# Patient Record
Sex: Female | Born: 1960 | Race: White | Hispanic: No | Marital: Single
Health system: Midwestern US, Community
[De-identification: ages and names within clinical notes are randomized; demographics above are authoritative.]

## PROBLEM LIST (undated history)

## (undated) DIAGNOSIS — E119 Type 2 diabetes mellitus without complications: Secondary | ICD-10-CM

## (undated) DIAGNOSIS — Z87440 Personal history of urinary (tract) infections: Secondary | ICD-10-CM

## (undated) DIAGNOSIS — R112 Nausea with vomiting, unspecified: Secondary | ICD-10-CM

## (undated) DIAGNOSIS — Z86010 Personal history of colonic polyps: Secondary | ICD-10-CM

## (undated) DIAGNOSIS — T7840XA Allergy, unspecified, initial encounter: Secondary | ICD-10-CM

## (undated) DIAGNOSIS — K219 Gastro-esophageal reflux disease without esophagitis: Secondary | ICD-10-CM

## (undated) DIAGNOSIS — Z9889 Other specified postprocedural states: Secondary | ICD-10-CM

## (undated) DIAGNOSIS — R7303 Prediabetes: Secondary | ICD-10-CM

## (undated) DIAGNOSIS — I493 Ventricular premature depolarization: Secondary | ICD-10-CM

## (undated) HISTORY — DX: Gastro-esophageal reflux disease without esophagitis: K21.9

## (undated) HISTORY — DX: Prediabetes: R73.03

## (undated) HISTORY — PX: KNEE SURGERY: SHX244

## (undated) HISTORY — DX: Allergy, unspecified, initial encounter: T78.40XA

## (undated) HISTORY — PX: COLONOSCOPY: SHX174

## (undated) HISTORY — DX: Personal history of colonic polyps: Z86.010

## (undated) HISTORY — DX: Ventricular premature depolarization: I49.3

## (undated) HISTORY — DX: Nausea with vomiting, unspecified: R11.2

## (undated) HISTORY — PX: NASAL SINUS SURGERY: SHX719

## (undated) HISTORY — DX: Personal history of urinary (tract) infections: Z87.440

## (undated) HISTORY — DX: Type 2 diabetes mellitus without complications: E11.9

## (undated) HISTORY — PX: POLYPECTOMY: SHX149

## (undated) HISTORY — DX: Other specified postprocedural states: Z98.890

---
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
## 1993-04-18 HISTORY — PX: VAGINAL HYSTERECTOMY: SUR661

## 2005-04-14 ENCOUNTER — Ambulatory Visit: Payer: Self-pay | Admitting: Cardiology

## 2005-04-18 HISTORY — PX: ESOPHAGOGASTRODUODENOSCOPY: SHX1529

## 2005-04-27 ENCOUNTER — Ambulatory Visit: Payer: Self-pay | Admitting: Cardiology

## 2005-06-06 ENCOUNTER — Ambulatory Visit: Payer: Self-pay | Admitting: Cardiology

## 2005-07-06 ENCOUNTER — Ambulatory Visit: Payer: Self-pay | Admitting: Cardiology

## 2006-01-09 ENCOUNTER — Ambulatory Visit: Payer: Self-pay | Admitting: Internal Medicine

## 2006-01-12 ENCOUNTER — Encounter (INDEPENDENT_AMBULATORY_CARE_PROVIDER_SITE_OTHER): Payer: Self-pay | Admitting: *Deleted

## 2006-01-12 ENCOUNTER — Ambulatory Visit: Payer: Self-pay | Admitting: Internal Medicine

## 2006-02-14 ENCOUNTER — Ambulatory Visit: Payer: Self-pay | Admitting: Internal Medicine

## 2006-10-04 ENCOUNTER — Ambulatory Visit: Payer: Self-pay | Admitting: Cardiology

## 2007-01-09 ENCOUNTER — Ambulatory Visit: Payer: Self-pay | Admitting: Cardiology

## 2007-07-11 ENCOUNTER — Emergency Department (HOSPITAL_COMMUNITY): Admission: EM | Admit: 2007-07-11 | Discharge: 2007-07-12 | Payer: Self-pay | Admitting: Emergency Medicine

## 2007-08-06 ENCOUNTER — Emergency Department (HOSPITAL_COMMUNITY): Admission: EM | Admit: 2007-08-06 | Discharge: 2007-08-06 | Payer: Self-pay | Admitting: Emergency Medicine

## 2009-05-29 ENCOUNTER — Ambulatory Visit (HOSPITAL_COMMUNITY): Admission: RE | Admit: 2009-05-29 | Discharge: 2009-05-29 | Payer: Self-pay | Admitting: Family Medicine

## 2010-08-31 NOTE — Assessment & Plan Note (Signed)
[facility]                          [facility]   Gwenlyn Fudge ASALEE BARRETTE                 MRN:          [id number]  DATE:10/04/2006                            DOB:          25-Jul-1960    HISTORY OF PRESENT ILLNESS:  Patient is a 50 year old female with a  history of chest pain and palpitations.  The patient underwent a cardiac  CT scan that was essentially negative for coronary artery disease.  The  patient reports occasional sharp shooting pain which are very atypical.  She still has occasional palpitations, and they have been in the past  correlated with documented PVCs.  She was also last year treated for  eosinophilic esophagitis.  She states that her problems have markedly  improved since last year.  She is still concerned about occasional  palpitations which cause her some level of discomfort.   MEDICATIONS:  1. Omeprazole 20 mg a day.  2. Estrodiol 2 mg a day.  3. Lexapro 10 mg a day.  4. Advair as directed.   PHYSICAL EXAMINATION:  VITAL SIGNS:  Blood pressure 128/89, heart rate  82 beats per minute.  Weight is 170 pounds.  NECK:  Normal carotid upstrokes.  No carotid bruits.  LUNGS:  Clear breath sounds bilaterally.  HEART:  Regular rate and rhythm.  Normal S1 and S2.  No murmurs, gallops  or rubs.  ABDOMEN:  Soft, nontender.  No rebound or guarding.  Good bowel sounds.  EXTREMITIES:  No clubbing, cyanosis or edema.  NEURO:  Patient is alert and oriented, grossly nonfocal.   PROBLEM LIST:  1. Atypical chest pain with negative cardiac CT scan.  2. Palpitations with documented premature ventricular contractions.  3. Anxiety.  4. Status post eosinophilic esophagitis.   PLAN:  1. I had a long discussion with the patient regarding PVCs.  She says      she feels they are causing some level of discomfort and is open to      further treatment.  2. Given the fact that the patient could also have esophageal spasm, I  have opted for low-dose Cardizem CD dosing on 120 mg p.o. daily.  3. EKG was reviewed in the office and was essentially within normal      limits.  I told the patient that we will follow up with her in      eight months and assess her response to treatment.    Learta Codding, MD,FACC  Electronically Signed   GED/MedQ  DD: 10/04/2006  DT: 10/04/2006  Job #: [id number]

## 2010-08-31 NOTE — Assessment & Plan Note (Signed)
[facility]                          [facility]   Michele Lynch MARCELLA CHARLSON                 MRN:          [id number]  DATE:01/09/2007                            DOB:          Mar 04, 1961    HISTORY OF PRESENT ILLNESS:  The patient is a 50 year old female with a  history of chest pain and palpitations. The patient has been previously  screened with a cardiac CT scan and had a calcium score of 0 and no  significant epicardial coronary artery disease. She also had a normal  stress test. She still reports occasional palpitations, but appeared to  have much improved with the addition of Cardizem during the last visit.  The patient states that occasionally she has substernal chest pain  associated with palpitations, but there is no exertional chest pain or  dyspnea. The patient has lost quite a bit of weight and is making a  concerted effort to lose more weight. She states also she is under a  large amount of stress.   MEDICATIONS:  1. Estradiol 2 mg p.o. daily.  2. Cardizem 120 mg p.o. daily.   PHYSICAL EXAMINATION:  VITAL SIGNS: Blood pressure 113/72, heart rate 85  beats per minute, weight is 157 pounds.  NECK: Normal carotid upstroke. No carotid bruits.  LUNGS:  Clear breath sounds bilaterally.  HEART: Regular rate and rhythm. Normal S1, S2. No murmur, rubs or  gallops.  ABDOMEN: Soft and nontender. No rebound or guarding. Good bowel sounds.  EXTREMITIES: No cyanosis, clubbing or edema.  NEURO: The patient is alert, oriented and grossly nonfocal.   PROBLEM LIST:  1. Atypical chest pain. Negative cardiac CT.  2. Palpitations with improvement on calcium channel blocker therapy.  3. Anxiety.  4. Status post eosinophilic esophagitis.   PLAN:  1. The patient is doing much better. Still has occasional      palpitations. We will increase her Cardizem to 180 mg p.o. daily.  2. The patient continues to lose weight and the patient is  adhering to      a healthier lifestyle.  3. Also, have given the patient a prescription for p.r.n.      nitroglycerine as she has reminded me that I promised this to her      the last time, although I still suspect that the patient's chest      pain may be more related to esophageal spasm. I told her certainly      if there is any concern of      angina that nitroglycerine might be useful. However, she still      report to me if she needs to use nitroglycerine and further      evaluation may be required.     Learta Codding, MD,FACC  Electronically Signed    GED/MedQ  DD: 01/09/2007  DT: 01/09/2007  Job #: [id number]   cc:   Donzetta Sprung, MD

## 2010-09-03 NOTE — Assessment & Plan Note (Signed)
[facility]                           GASTROENTEROLOGY OFFICE NOTE   Michele Lynch, Michele Lynch                 MRN:          [id number]  DATE:01/09/2006                            DOB:          March 19, 1961    GASTROENTEROLOGY CONSULTATION NOTE:   CHIEF COMPLAINT:  Lump in throat, swallowing problems, chest pain.   ASSESSMENT:  A 50 year old white woman with an extensive cardiology workup,  not revealing a cause for chest pain.  She does have some intermittent solid  food dysphagia, some occasional heartburn, and intermittent episodes of  chest pain for about a year.  She also has a globus sensation and some  hoarseness.  Her symptoms have not really improved on Omeprazole twice daily  for 3 months and Nexium daily prior to that.   RECOMMENDATIONS AND PLAN:  1. Upper GI endoscopy with possible esophageal dilatation.  Risks,      benefits and indications are explained.  2. Consider a HIDA scan with ejection fraction.  An ultrasound has been      negative.  3. Consider manometry and/or barium swallow.  She has had some relief with      nitroglycerin, which does suggest some esophageal dysmotility.   HISTORY:  This pleasant 50 year old white woman has been seen in [place] and  been hospitalized there, and Dr. Andee Lineman has cared for her.  She has had  intermittent substernal chest pain, helped by nitroglycerin, but has had an  extensive evaluation including cardiac CT and rule out enzymes and negative  adenosine Cardiolite studies back into 2006.  It sounds like Nexium helped  for a while, or at least she got better, in view of an office note in  February of 2007, but the symptoms have returned.  She tells me they were  present on Nexium and that it did not occur just with the switch to  omeprazole which was driven by insurance and cost issues.  She has had a  question of polyps on an ultrasound, and Dr. Reuel Boom suggested ordered a [facility]  HIDA scan, but it  sounds like that has not been done, though it was done 12  years ago when she had similar problems at the time of a hysterectomy.  She  has been given a trial of Xanax, Prozac and maybe even tricyclic agents (not  sure).  She has had palpitations, as well, and says her husband seems to  have heart rate increases, palpitations and diarrhea during some of the same  times she has these problems.  If she does not drink a lot of water with her  food, she feels like she will choke on it.  She does seem to describe some  suprasternal sticking points at times and has a constant lump in the throat  often.  She occasionally will have a hot burning sensation in the chest  characterized as reflux that is helped by Pepto-Bismol.  Her thyroid studies  have been normal.   MEDICATIONS:  1. Omeprazole 20 mg b.i.d.  2. Estradiol 2 mg daily.  3. Valtrex is used as needed.  This was given for suspected viral syndrome  and cold sores that also occurred with her husband, she tells me.   DRUG ALLERGIES:  None known.   PAST MEDICAL HISTORY:  1. As above.  2. Allergic rhinosinusitis with sinus surgery x2 and not an active problem      now.  3. Cesarean section x2.  4. Bladder stem dilation at age 49.  5. Possible irritable bowel syndrome diagnosis.  6. Prior hysterectomy and oophorectomy in 1995 (bilateral).  7. Tonsillectomy and adenoidectomy.   FAMILY HISTORY:  Our review is negative for GI problems including colon  cancer.  There is a history of leukemia in the family.   SOCIAL HISTORY:  She is married.  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  Two sons.  Rare alcohol.  No tobacco or drugs.   REVIEW OF SYSTEMS:  A few episodes of fatigue and night sweats.  She does  have some sore throat.  Otherwise, as above or negative.  See medical  history form.   PHYSICAL EXAMINATION:  GENERAL:  Well-developed, well-nourished white woman.  Height 5 feet 3 inches, weight 181 pounds.  She is  overweight to obese.  VITAL SIGNS:  Blood pressure 110/70, pulse 76.  HEENT:  Eyes anicteric.  ENT normal mouth and oropharynx.  NECK:  Supple.  No thyromegaly or mass.  CHEST:  Clear.  HEART:  S1 and S2.  No murmurs or gallops.  ABDOMEN:  Soft, nontender, without organomegaly or mass.  There are surgical  scars from a previous hysterectomy.  EXTREMITIES:  No peripheral edema.  SKIN:  No rash.  NEUROLOGIC/PSYCHIATRIC:  She is alert and oriented x3.   DATA REVIEWED:  Office notes from Dr. Reuel Boom and Dr. Andee Lineman.  Hospitalization records from [facility] this year.   NOTE:  A diagnosis of panic attacks is described in her hospitalization  records.  She has been given a trial of Lexapro, Astelin nasal spray.  She  has been on Xenical.  There is also a history of a previous exploratory  laparotomy and hyperlipidemia.                                   Iva Boop, MD,FACG   CEG/MedQ  DD:  01/10/2006  DT:  01/12/2006  Job #:  [id number]   cc:   Noah Charon, MD,FACC

## 2010-09-03 NOTE — Assessment & Plan Note (Signed)
[facility]                           GASTROENTEROLOGY OFFICE NOTE   Michele Lynch, Michele Lynch                 MRN:          [id number]  DATE:02/14/2006                            DOB:          1961/02/28    CHIEF COMPLAINT:  Followup of eosinophilic esophagitis.   Ms. Michele Lynch had started fluticasone. I had told her to try that starting on  October 5. The lump in the throat is a little better, I think chest pain is  a little bit better. She is complaining of constipation problems for the  past year where she is having difficulty moving her bowels, having to take  Dulcolax every day. This is a new problem. However, all of these problems  are similar to the problems she had 12 or 13 years ago and she had an EGD  and a colonoscopy at that time. Other medications including her omeprazole  and her Estradiol. Valtrex p.r.n. and Dulcolax. There are no known drug  allergies.   REVIEW OF SYSTEMS:  Her weight has increased about 20 pounds or so over a  period of a year or so. Her TSH was normal a year ago.   PAST MEDICAL HISTORY:  Reviewed and unchanged from consultation of January 09, 2006.   Weight 186 pounds, pulse 70, blood pressure 120/86.   ASSESSMENT:  1. Working diagnosis of eosinophilic esophagitis based upon endoscopic      findings and biopsies. There are increased eosinophils in the biopsies      of the esophagus. Just starting on fluticasone therapy with some      response. I had told her to take the fluticasone and then swallow      water, that was incorrect. She should rinse the mouth and spit it out      after swallowing that. She is using that twice a day. Will continue      that.  2. Change in bowel habits with constipation, bloating, weight gain. TSH      was normal previously. Will check a TSH but also I think at her age      with the change in bowel habits, a colonoscopy is appropriate. The      risks, benefits, and indications  are explained to the patient. She      understands and agrees to proceed.   PLAN:  As outlined above. Further plans pending clinical course. Consider  food allergy testing. 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 She had allergy  testing a number of years ago with these problems. I am not aware that  eosinophilic esophagitis is related to pet allergies but will look into the  availability of food allergy testing.     Iva Boop, MD,FACG   CEG/MedQ  DD: 02/14/2006  DT: 02/14/2006  Job #: [id number]   cc:   Noah Charon, MD,FACC

## 2011-01-10 LAB — CBC
Platelets: 365
RDW: 12.3
WBC: 10.4

## 2011-01-10 LAB — PROTIME-INR
INR: 0.9
Prothrombin Time: 12.3

## 2011-01-10 LAB — DIFFERENTIAL
Lymphocytes Relative: 30
Lymphs Abs: 3.2
Monocytes Absolute: 0.9
Neutrophils Relative %: 54

## 2011-01-10 LAB — RAPID URINE DRUG SCREEN, HOSP PERFORMED
Amphetamines: NOT DETECTED
Benzodiazepines: NOT DETECTED
Cocaine: NOT DETECTED
Tetrahydrocannabinol: NOT DETECTED

## 2011-01-10 LAB — BASIC METABOLIC PANEL
BUN: 8
GFR calc Af Amer: >60
Glucose, Bld: 115 — ABNORMAL HIGH
Potassium: 3.5

## 2011-01-11 LAB — CBC
Hemoglobin: 13.9
MCHC: 34.6
Platelets: 331

## 2011-01-11 LAB — URINALYSIS, ROUTINE W REFLEX MICROSCOPIC: Nitrite: NEGATIVE

## 2011-01-11 LAB — COMPREHENSIVE METABOLIC PANEL
ALT: 52 — ABNORMAL HIGH
BUN: 9
CO2: 31
Calcium: 9
Chloride: 104
Creatinine, Ser: 0.78
GFR calc Af Amer: >60
Glucose, Bld: 95
Potassium: 4
Sodium: 140

## 2011-01-11 LAB — DIFFERENTIAL
Eosinophils Relative: 4
Lymphs Abs: 3
Neutro Abs: 4.7
Neutrophils Relative %: 54

## 2011-01-11 LAB — APTT: aPTT: 27

## 2012-10-16 ENCOUNTER — Encounter: Payer: Self-pay | Admitting: Internal Medicine

## 2012-12-12 ENCOUNTER — Ambulatory Visit (AMBULATORY_SURGERY_CENTER): Payer: 59 | Admitting: *Deleted

## 2012-12-12 VITALS — Ht 64.0 in | Wt 209.6 lb

## 2012-12-12 DIAGNOSIS — Z1211 Encounter for screening for malignant neoplasm of colon: Secondary | ICD-10-CM

## 2012-12-12 MED ORDER — NA SULFATE-K SULFATE-MG SULF 17.5-3.13-1.6 GM/177ML PO SOLN: 1.0000 | Freq: Once | ORAL | Status: DC

## 2012-12-12 NOTE — Progress Notes (Signed)
No allergies to eggs or soy. No problems with anesthesia.  

## 2012-12-13 ENCOUNTER — Encounter: Payer: Self-pay | Admitting: Internal Medicine

## 2012-12-14 DIAGNOSIS — R079 Chest pain, unspecified: Secondary | ICD-10-CM

## 2012-12-26 ENCOUNTER — Ambulatory Visit (AMBULATORY_SURGERY_CENTER): Payer: 59 | Admitting: Internal Medicine

## 2012-12-26 ENCOUNTER — Encounter: Payer: Self-pay | Admitting: Internal Medicine

## 2012-12-26 VITALS — BP 112/70 | HR 65 | Temp 97.0°F | Resp 34 | Ht 64.0 in | Wt 203.0 lb

## 2012-12-26 DIAGNOSIS — D126 Benign neoplasm of colon, unspecified: Secondary | ICD-10-CM

## 2012-12-26 DIAGNOSIS — Z1211 Encounter for screening for malignant neoplasm of colon: Secondary | ICD-10-CM

## 2012-12-26 MED ORDER — SODIUM CHLORIDE 0.9 % IV SOLN: 500.0000 mL | INTRAVENOUS | Status: DC

## 2012-12-26 NOTE — Patient Instructions (Addendum)
I found and removed 2 tiny polyps. They look benign - I was only able to pick up one of the polyps.  Everything else was normal.  I will let you know pathology results and when to have another routine colonoscopy by mail.   I appreciate the opportunity to care for you. Iva Boop, MD, FACG   YOU HAD AN ENDOSCOPIC PROCEDURE TODAY AT THE [facility]: Refer to the procedure report that was given to you for any specific questions about what was found during the examination.  If the procedure report does not answer your questions, please call your gastroenterologist to clarify.  If you requested that your care partner not be given the details of your procedure findings, then the procedure report has been included in a sealed envelope for you to review at your convenience later.  YOU SHOULD EXPECT: Some feelings of bloating in the abdomen. Passage of more gas than usual.  Walking can help get rid of the air that was put into your GI tract during the procedure and reduce the bloating. If you had a lower endoscopy (such as a colonoscopy or flexible sigmoidoscopy) you may notice spotting of blood in your stool or on the toilet paper. If you underwent a bowel prep for your procedure, then you may not have a normal bowel movement for a few days.  DIET: Your first meal following the procedure should be a light meal and then it is ok to progress to your normal diet.  A half-sandwich or bowl of soup is an example of a good first meal.  Heavy or fried foods are harder to digest and may make you feel nauseous or bloated.  Likewise meals heavy in dairy and vegetables can cause extra gas to form and this can also increase the bloating.  Drink plenty of fluids but you should avoid alcoholic beverages for 24 hours.  ACTIVITY: Your care partner should take you home directly after the procedure.  You should plan to take it easy, moving slowly for the rest of the day.  You can resume normal activity the  day after the procedure however you should NOT DRIVE or use heavy machinery for 24 hours (because of the sedation medicines used during the test).    SYMPTOMS TO REPORT IMMEDIATELY: A gastroenterologist can be reached at any hour.  During normal business hours, 8:30 AM to 5:00 PM Monday through Friday, call [phone].  After hours and on weekends, please call the GI answering service at [phone] who will take a message and have the physician on call contact you.   Following lower endoscopy (colonoscopy or flexible sigmoidoscopy):  Excessive amounts of blood in the stool  Significant tenderness or worsening of abdominal pains  Swelling of the abdomen that is new, acute  Fever of 100F or higher FOLLOW UP: If any biopsies were taken you will be contacted by phone or by letter within the next 1-3 weeks.  Call your gastroenterologist if you have not heard about the biopsies in 3 weeks.  Our staff will call the home number listed on your records the next business day following your procedure to check on you and address any questions or concerns that you may have at that time regarding the information given to you following your procedure. This is a courtesy call and so if there is no answer at the home number and we have not heard from you through the emergency physician on call, we will assume that  you have returned to your regular daily activities without incident.  SIGNATURES/CONFIDENTIALITY: You and/or your care partner have signed paperwork which will be entered into your electronic medical record.  These signatures attest to the fact that that the information above on your After Visit Summary has been reviewed and is understood.  Full responsibility of the confidentiality of this discharge information lies with you and/or your care-partner.  Please read over handouts about polyps  Continue your normal medications

## 2012-12-26 NOTE — Progress Notes (Signed)
Called to room to assist during endoscopic procedure.  Patient ID and intended procedure confirmed with present staff. Received instructions for my participation in the procedure from the performing physician.  

## 2012-12-26 NOTE — Progress Notes (Signed)
Patient did not experience any of the following events: a burn prior to discharge; a fall within the facility; wrong site/side/patient/procedure/implant event; or a hospital transfer or hospital admission upon discharge from the facility. (G8907) Patient did not have preoperative order for IV antibiotic SSI prophylaxis. (G8918)  

## 2012-12-26 NOTE — Progress Notes (Signed)
Lidocaine-40mg IV prior to Propofol InductionPropofol given over incremental dosages 

## 2012-12-26 NOTE — Op Note (Signed)
[facility] [address] [place], [address]   COLONOSCOPY PROCEDURE REPORT  PATIENT: [name], [name]  MR#: [id number] BIRTHDATE: 1960/06/05 , 51  yrs. old GENDER: Female ENDOSCOPIST: Iva Boop, MD, [facility] PROCEDURE DATE:  12/26/2012 PROCEDURE:   Colonoscopy with snare polypectomy First Screening Colonoscopy - Avg.  risk and is 50 yrs.  old or older Yes.  Prior Negative Screening - Now for repeat screening. N/A  History of Adenoma - Now for follow-up colonoscopy & has been > or = to 3 yrs.  N/A  Polyps Removed Today? Yes. ASA CLASS:   Class II INDICATIONS:average risk screening and first colonoscopy. MEDICATIONS: propofol (Diprivan) 300mg  IV, MAC sedation, administered by CRNA, and These medications were titrated to patient response per physician's verbal order  DESCRIPTION OF PROCEDURE:   After the risks benefits and alternatives of the procedure were thoroughly explained, informed consent was obtained.  A digital rectal exam revealed no abnormalities of the rectum.   The LB [id number]  endoscope was introduced through the anus and advanced to the cecum, which was identified by both the appendix and ileocecal valve. No adverse events experienced.   The quality of the prep was excellent using Suprep  The instrument was then slowly withdrawn as the colon was fully examined.   COLON FINDINGS: Two diminutive sessile polyps were found in the ascending colon and at the splenic flexure.  A polypectomy was performed with a cold snare.  The resection was complete and the polyp tissue was partially retrieved (splenic flexure polyp not recovered).   The colon mucosa was otherwise normal.   A right colon retroflexion was performed.  Retroflexed views revealed no abnormalities. The time to cecum=2 minutes 0 seconds.  Withdrawal time=11 minutes 22 seconds.  The scope was withdrawn and the procedure completed. COMPLICATIONS: There were no  complications.  ENDOSCOPIC IMPRESSION: 1.   Two diminutive sessile polyps were found in the ascending colon and at the splenic flexure; polypectomy was performed with a cold snare 1 of 2 polyps recovered 2.   The colon mucosa was otherwise normal - excellent prep - first colonoscopy  RECOMMENDATIONS: Timing of repeat colonoscopy will be determined by pathology findings.   eSigned:  Iva Boop, MD, [facility] 12/26/2012 10:44 AM   cc: The Patient and Donzetta Sprung, MD   PATIENT NAME:  Michele Lynch, Michele Lynch MR#: [id number]

## 2012-12-27 ENCOUNTER — Telehealth: Payer: Self-pay

## 2012-12-27 NOTE — Telephone Encounter (Signed)
  Follow up Call-  Call back number 12/26/2012  Post procedure Call Back phone  # [phone]  Permission to leave phone message Yes     Patient questions:  Do you have a fever, pain , or abdominal swelling? no Pain Score  0 *  Have you tolerated food without any problems? yes  Have you been able to return to your normal activities? yes  Do you have any questions about your discharge instructions: Diet   no Medications  no Follow up visit  no  Do you have questions or concerns about your Care? no  Actions: * If pain score is 4 or above: No action needed, pain <4.

## 2013-01-01 ENCOUNTER — Encounter: Payer: Self-pay | Admitting: Internal Medicine

## 2013-01-01 DIAGNOSIS — Z860100 Personal history of colon polyps, unspecified: Secondary | ICD-10-CM | POA: Insufficient documentation

## 2013-01-01 HISTORY — DX: Personal history of colon polyps, unspecified: Z86.0100

## 2013-01-01 HISTORY — DX: Personal history of colonic polyps: Z86.010

## 2013-01-01 NOTE — Progress Notes (Signed)
Quick Note:  Sessile serrated polyp - diminutive Repeat colon 5 yrs - 2019 ______

## 2014-01-07 ENCOUNTER — Encounter: Payer: Self-pay | Admitting: Family Medicine

## 2014-01-07 ENCOUNTER — Ambulatory Visit (INDEPENDENT_AMBULATORY_CARE_PROVIDER_SITE_OTHER): Payer: 59 | Admitting: Family Medicine

## 2014-01-07 VITALS — BP 122/82 | HR 91 | Temp 98.2°F | Ht 65.0 in | Wt 218.5 lb

## 2014-01-07 DIAGNOSIS — R310 Gross hematuria: Secondary | ICD-10-CM

## 2014-01-07 DIAGNOSIS — R1032 Left lower quadrant pain: Secondary | ICD-10-CM

## 2014-01-07 DIAGNOSIS — Z7189 Other specified counseling: Secondary | ICD-10-CM

## 2014-01-07 DIAGNOSIS — R7989 Other specified abnormal findings of blood chemistry: Secondary | ICD-10-CM

## 2014-01-07 DIAGNOSIS — R946 Abnormal results of thyroid function studies: Secondary | ICD-10-CM

## 2014-01-07 DIAGNOSIS — Z7689 Persons encountering health services in other specified circumstances: Secondary | ICD-10-CM

## 2014-01-07 LAB — CBC WITH DIFFERENTIAL/PLATELET
BASOS PCT: 0.7 % (ref 0.0–3.0)
Basophils Absolute: 0.1 10*3/uL (ref 0.0–0.1)
EOS PCT: 6.3 % — AB (ref 0.0–5.0)
Eosinophils Absolute: 0.7 10*3/uL (ref 0.0–0.7)
HEMATOCRIT: 40.7 % (ref 36.0–46.0)
HEMOGLOBIN: 13.6 g/dL (ref 12.0–15.0)
LYMPHS ABS: 1.9 10*3/uL (ref 0.7–4.0)
Lymphocytes Relative: 17.9 % (ref 12.0–46.0)
MCHC: 33.4 g/dL (ref 30.0–36.0)
MCV: 87.7 fl (ref 78.0–100.0)
MONO ABS: 0.8 10*3/uL (ref 0.1–1.0)
Monocytes Relative: 7.4 % (ref 3.0–12.0)
NEUTROS PCT: 67.7 % (ref 43.0–77.0)
Neutro Abs: 7.3 10*3/uL (ref 1.4–7.7)
Platelets: 343 10*3/uL (ref 150.0–400.0)
RBC: 4.64 Mil/uL (ref 3.87–5.11)
RDW: 13.2 % (ref 11.5–15.5)
WBC: 10.8 10*3/uL — AB (ref 4.0–10.5)

## 2014-01-07 LAB — TSH: TSH: 1.65 u[IU]/mL (ref 0.35–4.50)

## 2014-01-07 LAB — T4, FREE: Free T4: 0.79 ng/dL (ref 0.60–1.60)

## 2014-01-07 NOTE — Progress Notes (Signed)
Pre visit review using our clinic review tool, if applicable. No additional management support is needed unless otherwise documented below in the visit note. 

## 2014-01-07 NOTE — Progress Notes (Signed)
No chief complaint on file.   HPI:  Michele Lynch is here to establish care. Has several chronic problems that she has had for years and is frustrated nobody has figured out what is going on. Last PCP and physical: had physical with labs in 06/2013 with prior PCP. CMP, CBC, CCP, ESR, TFTs, Cholesterol. Urine cytology and TVU  Has the following chronic problems and concerns today:  Patient Active Problem List   Diagnosis Date Noted  . Personal history of colonic polyps - pre-cancerous 01/01/2013   LLQ pelvic pain: -for 5 or more years, worsening, used to do a lot of heavy lifting, avoiding this now due to pain -s/p hysterectomy in 1995 -has seen her gynecologist and her gastroenterologist for this -pain is sharp and stabbing and burning, comes and goes, but constant when occurs, maybe worse with lifting or activities - avoiding these activities -denies: nausea, vomiting,hematochezia - but hx of hemorrhoids, constipation, diarrhea  Recurrent gross hematuria: -on and off for several years - this happens more when has abd pain -treated for UTI recently, hx of gross hematuria for several years -advised to see a urologist per her report but she has not seen the urologist -review of cytology from 06/2013 neg for malignant cells, Positive for red blood cells and trans cells present  Mild T3 abnormality and chronic elevated WBC and platelets on labs: -other thyroids normal on check in 06/2013 -she wants to see an endocrinologist  Brother has gout: -she has had elevated uric acid on labs  ROS negative for unless reported above: fevers, unintentional weight loss, hearing or vision loss, chest pain, palpitations, struggling to breath, hemoptysis, melena, hematochezia, hematuria, falls, loc, si, thoughts of self harm  Past Medical History  Diagnosis Date  . Pre-diabetes   . Personal history of colonic polyps - pre-cancerous 01/01/2013  . History of bladder infections   . Allergy   . PVC  (premature ventricular contraction)     Family History  Problem Relation Age of Onset  . Colon cancer Neg Hx   . Arthritis Mother   . COPD Mother   . Cancer Father     leukemia  . Heart disease Sister     PACs  . Diabetes Brother   . Gout Brother     History   Social History  . Marital Status: Divorced    Spouse Name: N/A    Number of Children: N/A  . Years of Education: N/A   Social History Main Topics  . Smoking status: Never Smoker   . Smokeless tobacco: Never Used  . Alcohol Use: No  . Drug Use: No  . Sexual Activity: None   Other Topics Concern  . None   Social History Narrative   Work or School: works for [employer]: lives alone      Spiritual Beliefs: Baptist      Lifestyle: walking 5 days; diet is "good"             Current outpatient prescriptions:Cholecalciferol (VITAMIN D3) 2000 UNITS TABS, Take by mouth daily., Disp: , Rfl: ;  estradiol (ESTRACE) 1 MG tablet, Take 1 mg by mouth daily., Disp: , Rfl:   EXAM:  Filed Vitals:   01/07/14 1129  BP: 122/82  Pulse: 91  Temp: 98.2 F (36.8 C)    Body mass index is 36.36 kg/(m^2).  GENERAL: vitals reviewed and listed above, alert, oriented, appears well hydrated and in no acute distress  HEENT: atraumatic,  conjunttiva clear, no obvious abnormalities on inspection of external nose and ears  NECK: no obvious masses on inspection  LUNGS: clear to auscultation bilaterally, no wheezes, rales or rhonchi, good air movement  CV: HRRR, no peripheral edema  ABD: TTP on superficial palpation of LLQ abd wall tissues above the scar line, mild bulge, increased bulge and TTP with with valsalva  MS: moves all extremities without noticeable abnormality  PSYCH: pleasant and cooperative, no obvious depression or anxiety  ASSESSMENT AND PLAN:  Discussed the following assessment and plan:  Gross hematuria - Plan: Ambulatory referral to Urology  Borderline abnormal TFTs - Plan: TSH, T4,  Free, T3 -repeat TFT  LLQ pain - Plan: Ambulatory referral to General Surgery -I suspect a hernia -referral to general surgery for evaluation  Encounter to establish care   -We reviewed the PMH, PSH, FH, SH, Meds and Allergies. -We provided refills for any medications we will prescribe as needed. -We addressed current concerns per orders and patient instructions. -We have asked for records for pertinent exams, studies, vaccines and notes from previous providers. -We have advised patient to follow up per instructions below.   -Patient advised to return or notify a doctor immediately if symptoms worsen or persist or new concerns arise.  There are no Patient Instructions on file for this visit.   Colin Benton R.

## 2014-01-07 NOTE — Patient Instructions (Signed)
-  We have ordered labs or studies at this visit. It can take up to 1-2 weeks for results and processing. We will contact you with instructions IF your results are abnormal. Normal results will be released to your [facility]. If you have not heard from [place] or can not find your results in [facility] in 2 weeks please contact our office.  -We placed a referral for you as discussed to the general surgeon to see if you have a hernia and to the urologist abut the blood in the urine. It usually takes about 1-2 weeks to process and schedule this referral. If you have not heard from [place] regarding this appointment in 2 weeks please contact our office.   -PLEASE SIGN UP FOR MYCHART TODAY   We recommend the following healthy lifestyle measures: - eat a healthy diet consisting of lots of vegetables, fruits, beans, nuts, seeds, healthy meats such as white chicken and fish and whole grains.  - avoid fried foods, fast food, processed foods, sodas, red meet and other fattening foods.  - get a least 150 minutes of aerobic exercise per week.   Follow up in: 2-3 months

## 2014-01-07 NOTE — Addendum Note (Signed)
Addended by: Elmer Picker on: 01/07/2014 03:02 PM   Modules accepted: Orders

## 2014-01-08 LAB — T3: T3, Total: 160.2 ng/dL (ref 80.0–204.0)

## 2014-01-23 ENCOUNTER — Other Ambulatory Visit (INDEPENDENT_AMBULATORY_CARE_PROVIDER_SITE_OTHER): Payer: Self-pay | Admitting: General Surgery

## 2014-01-23 DIAGNOSIS — R319 Hematuria, unspecified: Secondary | ICD-10-CM

## 2014-01-23 DIAGNOSIS — R1032 Left lower quadrant pain: Secondary | ICD-10-CM

## 2014-01-24 ENCOUNTER — Encounter: Payer: Self-pay | Admitting: Family Medicine

## 2014-01-28 ENCOUNTER — Ambulatory Visit
Admission: RE | Admit: 2014-01-28 | Discharge: 2014-01-28 | Disposition: A | Discharge status: Home / Self Care | Payer: 59 | Source: Ambulatory Visit | Attending: General Surgery | Admitting: General Surgery

## 2014-01-28 DIAGNOSIS — R1032 Left lower quadrant pain: Secondary | ICD-10-CM

## 2014-01-28 DIAGNOSIS — R319 Hematuria, unspecified: Secondary | ICD-10-CM

## 2014-01-28 MED ORDER — IOHEXOL 300 MG/ML  SOLN
125.0000 mL | Freq: Once | INTRAMUSCULAR | Status: AC | PRN
Start: 2014-01-28 — End: 2014-01-28
  Administered 2014-01-28: 125 mL via INTRAVENOUS

## 2014-02-07 ENCOUNTER — Ambulatory Visit (INDEPENDENT_AMBULATORY_CARE_PROVIDER_SITE_OTHER): Payer: 59 | Admitting: Urology

## 2014-02-07 DIAGNOSIS — R1030 Lower abdominal pain, unspecified: Secondary | ICD-10-CM

## 2014-02-07 DIAGNOSIS — R310 Gross hematuria: Secondary | ICD-10-CM

## 2014-07-30 ENCOUNTER — Other Ambulatory Visit (HOSPITAL_COMMUNITY): Payer: Self-pay | Admitting: Family Medicine

## 2014-07-30 DIAGNOSIS — R200 Anesthesia of skin: Secondary | ICD-10-CM

## 2014-07-30 DIAGNOSIS — R1312 Dysphagia, oropharyngeal phase: Secondary | ICD-10-CM

## 2014-08-01 ENCOUNTER — Ambulatory Visit (HOSPITAL_COMMUNITY)
Admission: RE | Admit: 2014-08-01 | Discharge: 2014-08-01 | Disposition: A | Discharge status: Home / Self Care | Payer: 59 | Source: Ambulatory Visit | Attending: Family Medicine | Admitting: Family Medicine

## 2014-08-01 DIAGNOSIS — R4781 Slurred speech: Secondary | ICD-10-CM | POA: Diagnosis not present

## 2014-08-01 DIAGNOSIS — R200 Anesthesia of skin: Secondary | ICD-10-CM | POA: Diagnosis not present

## 2014-08-01 DIAGNOSIS — R1312 Dysphagia, oropharyngeal phase: Secondary | ICD-10-CM | POA: Insufficient documentation

## 2014-08-06 ENCOUNTER — Other Ambulatory Visit (HOSPITAL_COMMUNITY): Payer: Self-pay | Admitting: Family Medicine

## 2014-08-06 DIAGNOSIS — D496 Neoplasm of unspecified behavior of brain: Secondary | ICD-10-CM

## 2014-08-08 ENCOUNTER — Ambulatory Visit (HOSPITAL_COMMUNITY)
Admission: RE | Admit: 2014-08-08 | Discharge: 2014-08-08 | Disposition: A | Discharge status: Home / Self Care | Payer: 59 | Source: Ambulatory Visit | Attending: Family Medicine | Admitting: Family Medicine

## 2014-08-08 DIAGNOSIS — R200 Anesthesia of skin: Secondary | ICD-10-CM | POA: Insufficient documentation

## 2014-08-08 DIAGNOSIS — R930 Abnormal findings on diagnostic imaging of skull and head, not elsewhere classified: Secondary | ICD-10-CM | POA: Insufficient documentation

## 2014-08-08 DIAGNOSIS — H538 Other visual disturbances: Secondary | ICD-10-CM | POA: Diagnosis present

## 2014-08-08 DIAGNOSIS — R4789 Other speech disturbances: Secondary | ICD-10-CM | POA: Diagnosis not present

## 2014-08-08 DIAGNOSIS — R531 Weakness: Secondary | ICD-10-CM | POA: Insufficient documentation

## 2014-08-08 DIAGNOSIS — D496 Neoplasm of unspecified behavior of brain: Secondary | ICD-10-CM

## 2014-09-12 ENCOUNTER — Encounter: Payer: Self-pay | Admitting: Neurology

## 2014-09-12 ENCOUNTER — Ambulatory Visit (INDEPENDENT_AMBULATORY_CARE_PROVIDER_SITE_OTHER): Payer: 59 | Admitting: Neurology

## 2014-09-12 VITALS — BP 154/90 | HR 70 | Resp 14 | Ht 63.0 in | Wt 230.0 lb

## 2014-09-12 DIAGNOSIS — R930 Abnormal findings on diagnostic imaging of skull and head, not elsewhere classified: Secondary | ICD-10-CM | POA: Diagnosis not present

## 2014-09-12 DIAGNOSIS — R1310 Dysphagia, unspecified: Secondary | ICD-10-CM

## 2014-09-12 DIAGNOSIS — E785 Hyperlipidemia, unspecified: Secondary | ICD-10-CM | POA: Diagnosis not present

## 2014-09-12 DIAGNOSIS — R7989 Other specified abnormal findings of blood chemistry: Secondary | ICD-10-CM

## 2014-09-12 DIAGNOSIS — E559 Vitamin D deficiency, unspecified: Secondary | ICD-10-CM | POA: Insufficient documentation

## 2014-09-12 DIAGNOSIS — R4789 Other speech disturbances: Secondary | ICD-10-CM | POA: Insufficient documentation

## 2014-09-12 DIAGNOSIS — R9089 Other abnormal findings on diagnostic imaging of central nervous system: Secondary | ICD-10-CM | POA: Insufficient documentation

## 2014-09-12 DIAGNOSIS — R600 Localized edema: Secondary | ICD-10-CM

## 2014-09-12 NOTE — Progress Notes (Signed)
[facility]  PATIENT: Michele Lynch DOB: 1960/10/05  REFERRING DOCTOR OR PCP:  Delman Cheadle SOURCE: patient, records and MRI/CT images on PACS  _________________________________   HISTORICAL  CHIEF COMPLAINT:  Chief Complaint  Patient presents with  . Dysphagia    Sts. 15 yrs. ago she began having severe fatigue, chronic pain.  She tested neg. for lyme dz. but sx. resolved with tetracycline. Several yrs. later she discovered her husband was having an affair.  She sts. sx. were completely resolved until about 8 yrs. ago when she discovered her husband was having an affair.  She began taking wt. loss meds--Xenical--began having dizziness, trouble thinking, fatigue, generalized itching.  Sx. improved after stopping Xenical. 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 Fatigue    vaginal strep. Sts. mult. HIV tests have been negative. She had another period where sx. were almost completely resolved.  3 yrs. ago she was in a relationship with a man who developed some sort of rash that looked like chicken pox.  She developed just "2 little dots that itched."  Sts. she then began having difficulty speaking, swallowing, felt she had worms in her stomach that traveled up to her throat and still feels like she has a lump in right anterior throat.  Sts. she sometimes chokes on her own   . Edema    saliva, and has had pneumonia sx. 3 times.  Sts. no imaging of her neck has been done but sts. she had an mri of her brain and was told she has spots "and I'm not really old enough to be showing that kind of change."/fim    HISTORY OF PRESENT ILLNESS:  I had the pleasure of seeing your patient, Michele Lynch, at [facility] for neurologic consultation regarding her dysphagia and difficulty speaking she notes that the symptoms began about 3 years ago. At that time, she recalls that a ship with a person who had a rash that might of been chickenpox or some other  "pox".  She noted that she developed 2 little spots on her left leg that looked the same. They were very itchy around that time she began to have difficulty with dysphagia and with her speech.  A little before this time she again to have a sensation in her throat like worms were crawling up the outside of her anterior neck.    Around the same time as the mild rash, she began to notice edema in her body. This mostly affected the ankles in the hands but she noted elsewhere as well.  The swallowing difficulties have worsened over the past year. She now has episodes where she is unable to swallow her own saliva and needs to spit it out. She cannot associate any particular food with be more difficult to swallow than others. This problem fluctuates quite a bit and she sometimes wakes up and feels that she is strangling. She has noted some difficulty with word finding. She feels she sometimes does not say the word that she is thinking. She has been going on for about 3 years. She also notes that she sometimes will say things in the wrong order. She feels that these issues have slowly worsened over the past 3 years.  Last month, she had a CT scan showed a possible hypodensity in the pons on the right. Because of that, she went on to have an MRI of the brain. I personally reviewed the actual images of both of the  studies. The CT scan does show small focus that might of been artifact. The same spot in the MRI is normal. However, she does have some scattered hyperintense foci in the subcortical and deep white matter. Most of these foci are small and round. Thick and would more likely represent areas of small vessel ischemic change and demyelination.   I also reviewed recent lab work. CBC was remarkable only for minimal eosinophilia her lipid panel showed a minimally elevated cholesterol of 201 but her triglycerides were over 500. She reports fasting for the blood work.  About 15 years ago, he was having difficulty with  fatigue and excessive daytime sleepiness and was tested for Lyme disease. She was treated with tetracycline and felt better. 8 years ago, her husband was having an affair, she tested positive for HPV and vaginal strep but HIV was negative. Also around that time she took Xenical weight loss drug and had dizziness and fatigue. Because of that she had a cardiac workup was essentially negative.  More recently, she has had blood tests for vasculitis and for myasthenia gravis. She has a cousin who had myasthenia gravis.   She also has 2 family members with epilepsy.  In the past, she has had some difficulty with arthritic changes and swelling in her knee. She has not had any rash in the last year.     REVIEW OF SYSTEMS: Constitutional: No fevers, chills, sweats, or change in appetite Eyes: No visual changes, double vision, eye pain Ear, nose and throat: No hearing loss, also see above Cardiovascular: No chest pain, palpitations Respiratory: No shortness of breath at rest or with exertion.   No wheezes.   She snores but no gasps or pauses have been reported to her.    GastrointestinaI: No nausea, vomiting, diarrhea, abdominal pain, fecal incontinence Genitourinary: No dysuria, urinary retention or frequency.  No nocturia. Musculoskeletal: She reports swelling in her joints and some knee pain, more in past.   No neck pain, mild back pain Integumentary: No rash, pruritus, skin lesions Neurological: as above Psychiatric: No depression at this time.  No anxiety Endocrine: No palpitations, diaphoresis, change in appetite, change in weigh or increased thirst Hematologic/Lymphatic: No anemia, purpura, petechiae. Allergic/Immunologic: No itchy/runny eyes, nasal congestion, recent allergic reactions, rashes  ALLERGIES: No Known Allergies  HOME MEDICATIONS:  Current outpatient prescriptions:  .  Cholecalciferol (VITAMIN D3) 2000 UNITS TABS, Take by mouth daily., Disp: , Rfl:  .  estradiol  (ESTRACE) 1 MG tablet, Take 1 mg by mouth daily., Disp: , Rfl:  .  furosemide (LASIX) 20 MG tablet, , Disp: , Rfl:   PAST MEDICAL HISTORY: Past Medical History  Diagnosis Date  . Pre-diabetes   . Personal history of colonic polyps - pre-cancerous 01/01/2013  . History of bladder infections   . Allergy   . PVC (premature ventricular contraction)     PAST SURGICAL HISTORY: Past Surgical History  Procedure Laterality Date  . Tonsillectomy  1972  . Vaginal hysterectomy  1995  . Cesarean section  1991, 1993  . Esophagogastroduodenoscopy  2007    Gessner (gastropathy, esophagitis - GERD vs. EE)  . Nasal sinus surgery      FAMILY HISTORY: Family History  Problem Relation Age of Onset  . Colon cancer Neg Hx   . Arthritis Mother   . COPD Mother   . Cancer Father     leukemia  . Heart disease Sister     PACs  . Diabetes Brother   . Gout  Brother   . Multiple sclerosis Paternal Aunt     SOCIAL HISTORY:  History   Social History  . Marital Status: Divorced    Spouse Name: N/A  . Number of Children: N/A  . Years of Education: N/A   Occupational History  . Not on file.   Social History Main Topics  . Smoking status: Never Smoker   . Smokeless tobacco: Never Used  . Alcohol Use: No  . Drug Use: No  . Sexual Activity: Not on file   Other Topics Concern  . Not on file   Social History Narrative   Work or School: works for [employer]: lives alone      Spiritual Beliefs: Baptist      Lifestyle: walking 5 days; diet is "good"              PHYSICAL EXAM  Filed Vitals:   09/12/14 0952  BP: 154/90  Pulse: 70  Resp: 14  Height: $Remove'5\' 3"'[id number]$  (1.6 m)  Weight: 230 lb (104.327 kg)    Body mass index is 40.75 kg/(m^2).   General: The patient is well-developed and well-nourished and in no acute distress  Eyes:  Funduscopic exam shows normal optic discs and retinal vessels.  Neck: The neck is supple, no carotid bruits are noted.  The neck is  nontender.  Cardiovascular: The heart has a regular rate and rhythm with a normal S1 and S2. There were no murmurs, gallops or rubs. Lungs are clear to auscultation.  Skin: Extremities are without significant rash.   She does have 2 + pedal edema and 1+ edema in hands.  .  Musculoskeletal:  Back is non-tender.   Joints are non-erythematous and not hot.    Neurologic Exam  Mental status: The patient is alert and oriented x 3 at the time of the examination. The patient has apparent normal recent and remote memory, with an apparently normal attention span and concentration ability.   Speech is fairly normal though she had trouble coming up with the right word every couple minutes..  Cranial nerves: Extraocular movements are full. Pupils are equal, round, and reactive to light and accomodation.  Visual fields are full.  Facial symmetry is present. There is good facial sensation to soft touch bilaterally.Facial strength is normal.  Trapezius and sternocleidomastoid strength is normal. No dysarthria is noted.  The tongue is midline, and the patient has symmetric elevation of the soft palate. No obvious hearing deficits are noted.  Motor:  Muscle bulk is normal.   Tone is normal. Strength is  5 / 5 in all 4 extremities.   Sensory: Sensory testing is intact to pinprick, soft touch and vibration sensation in the arms.   In legs she reported symmetric vibration sensation but decreased touch/temp in right leg relative to left Coordination: Cerebellar testing reveals good finger-nose-finger and heel-to-shin bilaterally.  Gait and station: Station is normal.   Gait is normal. Tandem gait is normal. Romberg is negative.   Reflexes: Deep tendon reflexes are symmetric and normal bilaterally.   Plantar responses are flexor.    DIAGNOSTIC DATA (LABS, IMAGING, TESTING) - I reviewed patient records, labs, notes, testing and imaging myself where available.  Lab Results  Component Value Date   WBC 10.8*  01/07/2014   HGB 13.6 01/07/2014   HCT 40.7 01/07/2014   MCV 87.7 01/07/2014   PLT 343.0 01/07/2014      Component Value Date/Time   NA 140 08/06/2007 1218  K 4.0 08/06/2007 1218   CL 104 08/06/2007 1218   CO2 31 08/06/2007 1218   GLUCOSE 95 08/06/2007 1218   BUN 9 08/06/2007 1218   CREATININE 0.78 08/06/2007 1218   CALCIUM 9.0 08/06/2007 1218   PROT 6.1 08/06/2007 1218   ALBUMIN 3.9 08/06/2007 1218   AST 22 08/06/2007 1218   ALT 52* 08/06/2007 1218   ALKPHOS 67 08/06/2007 1218   BILITOT 0.7 08/06/2007 1218   GFRNONAA >60 08/06/2007 1218   GFRAA  08/06/2007 1218    >60        The eGFR has been calculated using the MDRD equation. This calculation has not been validated in all clinical    Lab Results  Component Value Date   TSH 1.65 01/07/2014       ASSESSMENT AND PLAN  Dysphagia - Plan: Sedimentation rate, Vit D  25 hydroxy (rtn osteoporosis monitoring), SLP modified barium swallow, DG Swallowing Func-Speech Pathology, ANA w/Reflex, Vitamin B12, Homocysteine  Verbal fluency disorder - Plan: SLP modified barium swallow, DG Swallowing Func-Speech Pathology, ANA w/Reflex, Vitamin B12  Pedal edema - Plan: Sedimentation rate, Vit D  25 hydroxy (rtn osteoporosis monitoring), SLP modified barium swallow, DG Swallowing Func-Speech Pathology, ANA w/Reflex, Vitamin B12  Abnormal finding on MRI of brain - Plan: Sedimentation rate, SLP modified barium swallow, DG Swallowing Func-Speech Pathology, ANA w/Reflex, Vitamin B12, Homocysteine  Low serum vitamin D - Plan: Vit D  25 hydroxy (rtn osteoporosis monitoring)  Vitamin D deficiency  Hyperlipidemia    In summary, Michele Lynch is a 54 year old woman who has had dysphagia and some difficulty with her speech over the past 3 years that has worsened over the past year. There was some concern about the possibility of MS based on her MRI. I personally reviewed the images. They're much more consistent with mild small vessel  ischemic changes than they are with demyelination. She does have one risk factor with hyperlipidemia. She is not currently on any treatment for this. I think the other teeth that this represents MS is very small. I'll check vasculitis labs and homocysteine to make sure that there is not a treatable cause of the MRI findings. I am concerned about the progressive dysphagia as she reports more difficulty with handling secretions. I will check vasculitis labs and get a swallow study. If some dysfunction is noted or symptoms worsen, I will also have her see ENT to make sure that there is not a structural etiology.  She will return to see me in 2 months or sooner if she has new or worsening neurologic symptoms or based on the testing.    Michele Lynch A. Felecia Shelling, MD, PhD 5/75/0518, 33:58 AM Certified in Neurology, Clinical Neurophysiology, Sleep Medicine, Pain Medicine and Neuroimaging  [facility] [address] [place], [place] [address] [phone]

## 2014-09-15 LAB — HOMOCYSTEINE: Homocysteine: 6.1 umol/L (ref 0.0–15.0)

## 2014-09-15 LAB — VITAMIN D 25 HYDROXY (VIT D DEFICIENCY, FRACTURES): VIT D 25 HYDROXY: 46.6 ng/mL (ref 30.0–100.0)

## 2014-09-15 LAB — SEDIMENTATION RATE: Sed Rate: 5 mm/hr (ref 0–40)

## 2014-09-15 LAB — ANA W/REFLEX: Anti Nuclear Antibody(ANA): NEGATIVE

## 2014-09-15 LAB — VITAMIN B12: VITAMIN B 12: 395 pg/mL (ref 211–946)

## 2014-09-16 ENCOUNTER — Telehealth: Payer: Self-pay

## 2014-09-16 NOTE — Telephone Encounter (Signed)
VM left to inform patient her Blood was normal.  She has been asked to call office back wit any questions

## 2014-09-16 NOTE — Telephone Encounter (Signed)
Patient called/returning Joy's call on lab results. I relayed the information. Patient understood.

## 2014-09-17 ENCOUNTER — Other Ambulatory Visit (HOSPITAL_COMMUNITY): Payer: Self-pay | Admitting: Neurology

## 2014-09-17 DIAGNOSIS — R1314 Dysphagia, pharyngoesophageal phase: Secondary | ICD-10-CM

## 2014-09-26 ENCOUNTER — Ambulatory Visit (HOSPITAL_COMMUNITY)
Admission: RE | Admit: 2014-09-26 | Discharge: 2014-09-26 | Disposition: A | Discharge status: Home / Self Care | Payer: 59 | Source: Ambulatory Visit | Attending: Neurology | Admitting: Neurology

## 2014-09-26 DIAGNOSIS — R4789 Other speech disturbances: Secondary | ICD-10-CM

## 2014-09-26 DIAGNOSIS — R1310 Dysphagia, unspecified: Secondary | ICD-10-CM

## 2014-09-26 DIAGNOSIS — R9089 Other abnormal findings on diagnostic imaging of central nervous system: Secondary | ICD-10-CM

## 2014-09-26 DIAGNOSIS — R1314 Dysphagia, pharyngoesophageal phase: Secondary | ICD-10-CM

## 2014-09-26 DIAGNOSIS — R600 Localized edema: Secondary | ICD-10-CM

## 2014-10-03 ENCOUNTER — Ambulatory Visit (INDEPENDENT_AMBULATORY_CARE_PROVIDER_SITE_OTHER): Payer: 59 | Admitting: Urology

## 2014-10-03 DIAGNOSIS — R312 Other microscopic hematuria: Secondary | ICD-10-CM

## 2014-10-21 ENCOUNTER — Telehealth: Payer: Self-pay | Admitting: Neurology

## 2014-10-21 DIAGNOSIS — R1314 Dysphagia, pharyngoesophageal phase: Secondary | ICD-10-CM

## 2014-10-21 NOTE — Telephone Encounter (Signed)
Patient called stating her job is requiring her to move to [place], [place] Aug 15. She has received swallow study and is having swallowing issues. She would like to be referred to specialist for throat before she moves. She also feels like she has lump in throat on right side and would like to be scanned to rule out out abnormality. Please call and advise. Patient can be reached at [phone].

## 2014-10-21 NOTE — Telephone Encounter (Signed)
Ok to refer for dysphagia

## 2014-10-22 NOTE — Telephone Encounter (Signed)
I have spoken with Michele Lynch this morning and offered referral to GI for dysphagia.  She is agreeable, so I have ordered referral this morning/fim

## 2014-10-27 ENCOUNTER — Telehealth: Payer: Self-pay | Admitting: Neurology

## 2014-10-27 NOTE — Telephone Encounter (Signed)
Reuben Likes called to inform our office that she would be closing the referral because the patient has requested to go to [place] instead. No need to return call.

## 2014-10-27 NOTE — Telephone Encounter (Signed)
Patient called and requested that we send a referral to [place]. Please call and advise.

## 2014-12-08 ENCOUNTER — Ambulatory Visit: Admit: 2014-12-08 | Discharge: 2014-12-08 | Payer: PRIVATE HEALTH INSURANCE | Attending: Family

## 2014-12-08 DIAGNOSIS — R05 Cough: Secondary | ICD-10-CM

## 2014-12-08 MED ORDER — omeprazole (PRILOSEC) 20 MG capsule
20 | ORAL_CAPSULE | Freq: Two times a day (BID) | ORAL | Status: AC
Start: 2014-12-08 — End: 2015-02-06

## 2014-12-08 MED ORDER — nystatin (MYCOSTATIN) 100,000 unit/mL suspension
100000 | Freq: Three times a day (TID) | ORAL | Status: AC
Start: 2014-12-08 — End: ?

## 2014-12-08 MED ORDER — ranitidine (ZANTAC) 150 MG tablet
150 | ORAL_TABLET | Freq: Two times a day (BID) | ORAL | Status: AC
Start: 2014-12-08 — End: 2015-02-06

## 2014-12-08 NOTE — Unmapped (Signed)
[facility]  DEPARTMENT OF OTOLARYNGOLOGY  HEAD & NECK SURGERY CENTER    Patient Name: Rebecca Le  Medical Record Number:  [id number]  Primary Care Physician:  No Pcp  Date of Visit: 12/08/2014     CHIEF COMPLAINT  New Patient Visit/ Consultation    HISTORY OF PRESENT ILLNESS      No matching staging information was found for the patient.    Last Imaging:   CT head wo 08/01/14- 5 mm low- attenuation in posterior brainstem on the right  MRI brain w/o 08/08/14- normal appearance of the brainstem. Scattered subcortical T2 hyper intensities greater then expected for age.   MBS 09/26/14- oropharyngeal swallow WFL, no aspiration or penetration despite frequent coughing, trace amount of back flow or thin liquids and solids into the level of the UES, does not reach the pharynx/larynx. Barium pooled in distal esophagus     Interval History:     Rebecca Le is a(n) 54 y.o. female with history of felling like foods are getting stuck on the right side of her throat for 6 months. She states it feels like the right side of my neck is parylized. Previous work up from neurology with no evidence of stroke  Denies any reflux sympotms or smoking history. Voice is stable. She states she wake up some night chocking on her saliva.     Pain: no  Otalgia: no  Odynophagia: no  Dysphagia: yes  Dyspnea: no  Lumps in neck: no  Hemoptysis: no  Fever: no  Chills: no  Sweats: no  Weight Loss: no    There is no problem list on file for this patient.    No past medical history on file.  No past surgical history on file.  No family history on file.  Social History     Social History   ??? Marital Status: Divorced     Spouse Name: N/A   ??? Number of Children: N/A   ??? Years of Education: N/A     Occupational History   ??? Not on file.     Social History Main Topics   ??? Smoking status: Never Smoker    ??? Smokeless tobacco: Never Used   ??? Alcohol Use: No   ??? Drug Use: No   ??? Sexual Activity: Not on file     Other Topics Concern   ??? Not  on file     Social History Narrative   ??? No narrative on file     DRUG/FOOD ALLERGIES  Review of patient's allergies indicates no known allergies.    CURRENT MEDICATIONS  Outpatient Encounter Prescriptions as of 12/08/2014   Medication Sig Dispense Refill   ??? furosemide (LASIX) 20 MG tablet      ??? multivitamin capsule Take 1 capsule by mouth daily.     ??? nystatin (MYCOSTATIN) 100,000 unit/mL suspension Take 5 mLs (500,000 Units total) by mouth 3 times a day. 473 mL 0   ??? omeprazole (PRILOSEC) 20 MG capsule Take 1 capsule (20 mg total) by mouth 2 times a day before meals. 60 capsule 3   ??? ranitidine (ZANTAC) 150 MG tablet Take 1 tablet (150 mg total) by mouth 2 times a day. 60 tablet 3   ??? [DISCONTINUED] estradiol (ESTRACE) 2 MG tablet        No facility-administered encounter medications on file as of 12/08/2014.     REVIEW OF SYSTEMS  See scanned Review of Systems sheet.    PHYSICAL EXAM  BP  147/86 mmHg   Pulse 89   Resp 20   Ht 5' 3 (1.6 m)   Wt 210 lb (95.255 kg)   BMI 37.21 kg/m2    General Appearance: well-developed, well-nourished and in no acute distress   Communication: I was able to converse well with the patient. Patient was able to answer questions adequately and appropriately.     Head & Face (general): normal appearance   Face (palpation): no sinus tenderness   Salivary Glands (palpation): salivary glands NL size.   Voice Quality: Normal     Eyes:   External: No masses or abnormality  Extraoccular Muscle: intact     Nose:   External: external nose without infection or abnormality.   Internal: anterior rhinoscopy performed. Turbinates non-erythematous, non-swollen, no pus, septum midline, mucosa intact. No masses, polyps or pus. No septal perforation.     Ears:   External Ears: external ears are of normal appearance. No masses, lesions or scars.   Otoscopic: canals clear, TM's intact with good movement, no fluid, no bulging, no pus, normal light reflex     House-Brackman Grading System for Facial Nerve  Dysfunction:   (Left) Grade 1: Normal movement   (Right) Grade 1: Normal movement     Mouth:   Lip/teeth/gums: healthy dentition, lips, teeth and gums in good condition. no gingival inflammation, no labial lesions. Mucosa: No leukoplakia or masses. Hard/soft palates and tongue of NL symmetry.   Tongue: normal midline, normal mucosa   Oropharynx/ Tonsils: pharyngeal walls and tonsillar fossae without abnormalities.   Hypopharynx: Gag   Larynx: Gag  Nasopharynx: Gag    Neck:   Neck Exam: no cervical, supraclavicular or auricular adenopathy   Thyroid Exam: no masses or fullness of thyroid     Respiratory Inspection:   Respiratory Effort: breathing comfortably, no increased work of breathing.     Cardiovascular:   Carotid arteries: pulses 2+, symmetric, no bruits     Skin:   Inspection: no lower extremity edema, rashes, lesions, or ulcerations, well developed, turgor intact   Palpation: no subcutaneous nodules or induration     Musculoskeletal:   Gait and Station: intact without difficulty     Neurologic:   Cranial Nerves: II - XII grossly intact, upper extremities-normal strength, lower extremities-normal strength, deep tendon reflexes-normal, cerebellar exam-normal.     Mental Status:   Orientation: oriented to time, place, and person   Mood and affect: NL mood and affect. A+O x 4.    LABORATORY  Past 24 hour labs: No results found for: WBC, HGB, HCT, MCV, PLT  No results found for: GLUCOSE, BUN, CREATININE, K, NA, CL, CALCIUM  No results found for: MG  No results found for: PHOS  No results found for: ALKPHOS, ALT, AST, BILITOT, ALBUMIN, BILIDIRECT, PROT  No results found for: LDH  No results found for: PTT  No results found for: AMYLASE  No results found for: LIPASE     Flexible Laryngoscopy Anesthesia: None, Topical 1% Xylocaine with Afrin, 4% Lidocaine  Estimated Blood Loss: None    Procedure:   After obtaining consent, the patient was placed in the examination chair in the upright position.  Decongestant and  topical anesthetic was sprayed in the After allowing adequate time for hemostatic effect, the flexible 4 mm laryngoscope was passed via the    Nasal Septum: normal;    Nasal Findings: normal;    Nasopharynx: normal    Eustachian Tube: normal    Oropharynx: normal    Base  of Tongue: fullness L>R, thrush    Epiglottis: normal    True Vocal Cord normal    False Vocal Cord: normal    True Vocal Cord Movement: normal    Hypopharynx Mucosa: normal  Mild pachydermia      * Patient tolerated the procedure well with no complications   * Patient was instructed not to eat for 30 minutes following procedure.   * Patient was instructed that they may notice minor bleeding.      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 Take pill 30 mins to 1 hour before breakfast and dinner  -Zantac BID  -Avoid eating late at night  -Nystatin  -Will get FEES  RTC after testing

## 2015-01-12 ENCOUNTER — Ambulatory Visit: Admit: 2015-01-12 | Discharge: 2015-01-12 | Payer: PRIVATE HEALTH INSURANCE | Attending: Speech-Language Pathologist

## 2015-01-12 ENCOUNTER — Ambulatory Visit: Admit: 2015-01-12 | Discharge: 2015-01-12 | Payer: PRIVATE HEALTH INSURANCE | Attending: Family

## 2015-01-12 DIAGNOSIS — K219 Gastro-esophageal reflux disease without esophagitis: Secondary | ICD-10-CM | POA: Insufficient documentation

## 2015-01-12 DIAGNOSIS — R1314 Dysphagia, pharyngoesophageal phase: Secondary | ICD-10-CM

## 2015-01-12 NOTE — Unmapped (Signed)
FIBEROPTIC ENDOSCOPIC EVALUATION OF SWALLOWING (FEES)     CPT code:  16109 (Flexible fiberoptic endoscopic evaluation of swallowing by cine or video recording) and 92610 (Evaluation of oral and pharyngeal swallowing function)    Primary:       Mild pharyngoesophageal phase   Secondary:  LPRD     Diagnosis/Indication:  Pharyngeal dysphagia: 787.23     BACKGROUND HISTORY:  Per Lorre Munroe, NP 12/08/14:  Last Imaging: ??  CT head wo 08/01/14- 5 mm low- attenuation in posterior brainstem on the right  MRI brain w/o 08/08/14- normal appearance of the brainstem. Scattered subcortical T2 hyper intensities greater then expected for age. ??  MBS 09/26/14- oropharyngeal swallow WFL, no aspiration or penetration despite frequent coughing, trace amount of back flow or thin liquids and solids into the level of the UES, does not reach the pharynx/larynx. Barium pooled in distal esophagus ??  Interval History: Ysabel is a(n) 54 y.o. female with history of felling like foods are getting stuck on the right side of her throat for 6 months. She states it feels like the right side of my neck is parylized. Previous work up from neurology with no evidence of stroke  Denies any reflux sympotms or smoking history. Voice is stable. She states she wake up some night chocking on her saliva. ??  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 Take pill 30 mins to 1 hour before breakfast and dinner  -Zantac BID  -Avoid eating late at night  -Nystatin  -Will get FEES  RTC after testing     Current weight: There were no vitals filed for this visit.  Wt Readings from Last 3 Encounters:   12/08/14 210 lb (95.255 kg)     Social History     Social History   ??? Marital Status: Divorced     Spouse Name: N/A   ??? Number of Children: N/A   ??? Years of Education: N/A     Occupational History   ??? Not on file.     Social History Main  Topics   ??? Smoking status: Never Smoker    ??? Smokeless tobacco: Never Used   ??? Alcohol Use: No   ??? Drug Use: No   ??? Sexual Activity: Not on file     Other Topics Concern   ??? Not on file     Social History Narrative      Pre-treatment SLP evaluation: No       Baseline diet level: Regular and thin  Current diet level: Regular and thin    No flowsheet data found.    ENT VOICE AND SWALLOWING Non-MyChart Patients 01/12/2015   VHI10 Total Score: (Total score <11 is WFL) 16   RSI Total Score: (Total score < 13 is WFL)  33   GFI Total Score: (Total score > 4 reflects problems in vocal function) 7   EAT-10 Total Score: (Total score > 3 indicates difficulty swallowing) 31   Dyspnea Index Total Score: 19       SUBJECTIVE:   Pt notes pain in the R throat all the time worsened with swallowing and talking. Difficulty swallowing for 2 yrs now. Did undergo CT and MRI in [place] suspecting CVA but it was all negative. She was also told that she was exposed to legineire's due to clogged water ducts in her house that  were venting into her vents. 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   She reports difficulty swallowing and feeling of effort. She has feeling of fullness in her throat all the time. Sh has coughing episodes at night. She leaves 3 hrs between dinner and bedtime. She was placed on anti-fungal and PPI by [facility]. Anti-fungal was very beneficial but coarse is complete. She is still taking PPI BID.     OBJECTIVE:  OROMOTOR EXAMINATION:  Parameter (cranial nerve involved) Observations   Facial observations at rest(CN VII) WFL   Labial ROM - protrusion (CN VII) Functional   Labial ROM - elongation(CN VII) Functional   Labial strength - (CN VII) WFL   Labial symmetry(CN VII) Symmetrical   Alignment of Base of Tongue (CN XII) At neutral   Lingual ROM (CN XII) WFL   Midblade elevation (CN XII) Normal   Lingual tip elevation (CN XII) Functional   Base of tongue retraction (CN XII) Functional    Lingual symmetry (CN XII) Functional   Lingual strength towards pressure (CN XII) WFL   Xerostomia (dry mouth) absent   Taste normal   Jaw alignment    Adequate (class 1)   Jaw opening 50 mm   Stimulation of gag reflex (CN X) Intact bilaterally   Velar elevation/retraction (CN V3) Intact bilaterally   Hard palate contour/vault WFL   Buccal Strength (CN VII) Intact bilaterally   Dentition Natural   Breathing Diaphragmatic   Speech characteristics WFL   Vocal quality WFL   Resonance    WFL   Hyolaryngeal excursion on palpation Normal   Mental status    Alert   Pain level (0 being no pain) 4/10 R neck     FEES EXAMINATION:  A Pentax rhinolaryngoscope was advanced through the right nares. A gel-based topical anesthetic (Lidocaine 4%) was applied to the insertion tube of the endoscope prior to advancement of scope. Views of the nasopharynx, oropharynx and laryngopharynx were viewed.     PRE-SWALLOWING ASSESSMENT:     Structure/Movement Observations   Palatal structure WFL   Velopharyngeal competence on sustained /s/ and alternate /p/ task [facility]   Base of Tongue movement WFL   Pharyngeal and Longitudinal constrictors on high pitched /i/ WFL   True vocal fold adduction and abduction on alternate sniff-/i/ task [facility]   Complete laryngeal adduction on breath hold WFL     SIGNS and SYMPTOMS:   Thin Puree   Soft Mechanical Solid Gel cap  Nectar-thick Honey-thick   Number of bolus trials:  4-8(single/chain), 17-20 9-12 13-15 1-3 16     Oral phase:     Lingual control / agility          A-P transit          Atypical chewing pattern          Lip closure          Piecemeal deglutition          VP / nasal regurgitation          OTHER:           Pharyngeal phase:           Tongue base retraction          Premature spillage  B = base of tongue  V = vallecular space   P = pyriforms          Delayed initiation  B = base of tongue  V = vallecular  space   P = pyriforms V         Epiglottic inversion   D = delayed  I = incomplete           Pharyngeal constriction reduced                     PENETRATION          ASPIRATION          Residue:   T = trace; M = mild;   MOD = moderate;  S= significant/severe           Lingual stasis          Base of tongue residue          Vallecular residue          Pyriform residue M         Posterior pharyngeal wall residue          Posterior cricoid residue  M         Retrofluxing into pharynx  M         Compensatory Strategies:  E= effective  NE = not effective          Secondary dry swallow  NE         Liquid wash           Effortful swallow          Chin-tuck NE         L Head turn            R Head turn          Supra-glottic swallow          Super supra-glottic swallow          OTHER:       IMPRESSIONS:  54 y.o. female with c/o dysphagia, odynophagia.    FEES evaluation revealed: Mild pharyngoesophageal phase dysphagia secondary to possible esophageal dysphagia. No penetration/aspiration noted. Pt has mild pooling of thin liquids in the pyriforms which appeared to retroflux from the esophagus as it cleared and then reappeared. Pt did have an MBS in the past yr which did show retroflux of bolus back into the pharynx and distal esophageal dysphagia. She did also have pachydermia and postr cricoid fullness all indicative of LPRD.      RECOMMENDATIONS:  Review of FEES by Lorre Munroe, NP  Diet Recommendations:  Solid Consistency: Regular  Liquid Consistency: Thin  Nutritional source: PO  Liquid Administration Via:  Cup and Straw  Postural Changes and/or Swallow Maneuvers:  Sitting upright 90%,   Compensations:   Small single sips, Small bites and Alternating liquids and solids  Swallowing therapy:not recommended.           Thank You  Danne Baxter, M.A., CCC-SLP  Speech Pathologist  [facility]   Dept of Otolaryngology - Head & Neck Surgery

## 2015-01-12 NOTE — Unmapped (Signed)
[facility]  DEPARTMENT OF OTOLARYNGOLOGY  HEAD & NECK SURGERY CENTER    Patient Name: Rebecca Le  Medical Record Number:  [id number]  Primary Care Physician:  No Pcp  Date of Visit: 01/12/2015     CHIEF COMPLAINT  Follow-up    HISTORY OF PRESENT ILLNESS      No matching staging information was found for the patient.    Last Imaging:   CT head wo 08/01/14- 5 mm low- attenuation in posterior brainstem on the right  MRI brain w/o 08/08/14- normal appearance of the brainstem. Scattered subcortical T2 hyper intensities greater then expected for age.   MBS 09/26/14- oropharyngeal swallow WFL, no aspiration or penetration despite frequent coughing, trace amount of back flow or thin liquids and solids into the level of the UES, does not reach the pharynx/larynx. Barium pooled in distal esophagus   FEES 01/02/15- severe pachydermia, pooling distal, no masses or lesion. Possible esophageal dysmotility     Interval History:     Rebecca Le is a(n) 54 y.o. female with history of felling like foods are getting stuck on the right side of her throat for 6 months. She states it feels like the right side of my neck is parylized. Previous work up from neurology with no evidence of stroke  Denies any reflux sympotms or smoking history. Voice is stable. She states she wake up some night chocking on her saliva.   Seeing being on H2 blocker and PPI BID she has noticed improvement in the sore throat but still feels globus sensation     Pain: no  Otalgia: no  Odynophagia: no  Dysphagia: yes  Dyspnea: no  Lumps in neck: no  Hemoptysis: no  Fever: no  Chills: no  Sweats: no  Weight Loss: no    There is no problem list on file for this patient.    History reviewed. No pertinent past medical history.  History reviewed. No pertinent past surgical history.  History reviewed. No pertinent family history.  Social History     Social History   ??? Marital Status: Divorced     Spouse Name: N/A   ??? Number of Children: N/A   ???  Years of Education: N/A     Occupational History   ??? Not on file.     Social History Main Topics   ??? Smoking status: Never Smoker    ??? Smokeless tobacco: Never Used   ??? Alcohol Use: No   ??? Drug Use: No   ??? Sexual Activity: Not on file     Other Topics Concern   ??? Not on file     Social History Narrative     DRUG/FOOD ALLERGIES  Review of patient's allergies indicates no known allergies.    CURRENT MEDICATIONS  Outpatient Encounter Prescriptions as of 01/12/2015   Medication Sig Dispense Refill   ??? multivitamin capsule Take 1 capsule by mouth daily.     ??? omeprazole (PRILOSEC) 20 MG capsule Take 1 capsule (20 mg total) by mouth 2 times a day before meals. 60 capsule 3   ??? ranitidine (ZANTAC) 150 MG tablet Take 1 tablet (150 mg total) by mouth 2 times a day. 60 tablet 3   ??? furosemide (LASIX) 20 MG tablet      ??? nystatin (MYCOSTATIN) 100,000 unit/mL suspension Take 5 mLs (500,000 Units total) by mouth 3 times a day. 473 mL 0     No facility-administered encounter medications on file as of 01/12/2015.  REVIEW OF SYSTEMS  See scanned Review of Systems sheet.    PHYSICAL EXAM  BP 116/73 mmHg   Pulse 75   Resp 14   Ht 5' 4 (1.626 m)   Wt 208 lb (94.348 kg)   BMI 35.69 kg/m2    General Appearance: well-developed, well-nourished and in no acute distress   Communication: I was able to converse well with the patient. Patient was able to answer questions adequately and appropriately.     Head & Face (general): normal appearance   Face (palpation): no sinus tenderness   Salivary Glands (palpation): salivary glands NL size.   Voice Quality: Normal     Eyes:   External: No masses or abnormality  Extraoccular Muscle: intact     Nose:   External: external nose without infection or abnormality.   Internal: anterior rhinoscopy performed. Turbinates non-erythematous, non-swollen, no pus, septum midline, mucosa intact. No masses, polyps or pus. No septal perforation.     Ears:   External Ears: external ears are of normal  appearance. No masses, lesions or scars.   Otoscopic: canals clear, TM's intact with good movement, no fluid, no bulging, no pus, normal light reflex     House-Brackman Grading System for Facial Nerve Dysfunction:   (Left) Grade 1: Normal movement   (Right) Grade 1: Normal movement     Mouth:   Lip/teeth/gums: healthy dentition, lips, teeth and gums in good condition. no gingival inflammation, no labial lesions. Mucosa: No leukoplakia or masses. Hard/soft palates and tongue of NL symmetry.   Tongue: normal midline, normal mucosa   Oropharynx/ Tonsils: pharyngeal walls and tonsillar fossae without abnormalities.   Hypopharynx: Gag   Larynx: Gag  Nasopharynx: Gag    Neck:   Neck Exam: no cervical, supraclavicular or auricular adenopathy   Thyroid Exam: no masses or fullness of thyroid     Respiratory Inspection:   Respiratory Effort: breathing comfortably, no increased work of breathing.     Cardiovascular:   Carotid arteries: pulses 2+, symmetric, no bruits     Skin:   Inspection: no lower extremity edema, rashes, lesions, or ulcerations, well developed, turgor intact   Palpation: no subcutaneous nodules or induration     Musculoskeletal:   Gait and Station: intact without difficulty     Neurologic:   Cranial Nerves: II - XII grossly intact, upper extremities-normal strength, lower extremities-normal strength, deep tendon reflexes-normal, cerebellar exam-normal.     Mental Status:   Orientation: oriented to time, place, and person   Mood and affect: NL mood and affect. A+O x 4.    LABORATORY  Past 24 hour labs: No results found for: WBC, HGB, HCT, MCV, PLT  No results found for: GLUCOSE, BUN, CREATININE, K, NA, CL, CALCIUM  No results found for: MG  No results found for: PHOS  No results found for: ALKPHOS, ALT, AST, BILITOT, ALBUMIN, BILIDIRECT, PROT  No results found for: LDH  No results found for: PTT  No results found for: AMYLASE  No results found for: LIPASE       ASSESSMENT & PLAN  54 year old female with  globus sensation as a results of LPR  -Swallowing trouble and voice changes  -Life style modifications  -Omeprazole (Priloec) one pill twice a day. Take pill 30 mins to 1 hour before breakfast and dinner  -Zantac BID  -Avoid eating late at night  -Will refer to GI for poss esophogeal dysmotility and colonoscopy   RTC 3 months

## 2015-02-06 MED ORDER — ranitidine (ZANTAC) 150 MG tablet
150 | ORAL_TABLET | Freq: Two times a day (BID) | ORAL | Status: AC
Start: 2015-02-06 — End: ?

## 2015-02-06 MED ORDER — omeprazole (PRILOSEC) 20 MG capsule
20 | ORAL_CAPSULE | Freq: Two times a day (BID) | ORAL | Status: AC
Start: 2015-02-06 — End: ?

## 2015-02-06 NOTE — Unmapped (Addendum)
Patient called she states that she never picked up the two medication at her [facility]. She called [facility] and they states they never received it.  I phoned them in.

## 2015-03-23 ENCOUNTER — Encounter: Payer: PRIVATE HEALTH INSURANCE | Attending: Family

## 2015-03-31 ENCOUNTER — Inpatient Hospital Stay: Admit: 2015-03-31 | Attending: Medical

## 2015-04-15 ENCOUNTER — Telehealth: Payer: Self-pay | Admitting: Neurology

## 2015-04-15 NOTE — Telephone Encounter (Addendum)
Pt called sts she has moved to [place], [place]. She is requesting a referral to a neurologist in that area. She is already established with [facility] in [facility] and would like to be referred there. Pt is aware that Dr Felecia Shelling is out of the office and will return tomorrow. She is ok to wait for return call tomorrow. She is also requesting the MRI results documented in the OV.

## 2015-04-16 NOTE — Telephone Encounter (Signed)
Please let her know that I do not know any neurologist in that area. I would recommend that she ask her primary care who they normally refer to area and we can forward records once requested

## 2015-04-16 NOTE — Telephone Encounter (Signed)
Rn call patient about her getting a referral to a neurologist in [place]. Rn stated that per Dr.Saters note he does not know a neurologist in that area. Rn stated Dr.Sater would like for her ask her PCP who do they refer their patients to who need a neurologist Rn stated once she finds a neuro doctor, all of her records can be sent by mail or fax. Also nurse stated a release form will have to be filled out by her. Pt verbalized understanding and will ask her PCP.

## 2015-04-30 ENCOUNTER — Inpatient Hospital Stay: Admit: 2015-04-30 | Payer: PRIVATE HEALTH INSURANCE

## 2015-04-30 DIAGNOSIS — K219 Gastro-esophageal reflux disease without esophagitis: Secondary | ICD-10-CM

## 2015-04-30 LAB — PFT3-BRONCHIAL CHALLENGE WITH METHACHOLINE
FEV1%: 84
FEV1/FVC EXP: 79.55
FEV1/FVC: 81.23 %
FEV1: 2.3 L
FVC%: 82
FVC: 2.84 L

## 2015-04-30 MED ORDER — albuterol (PROVENTIL) nebulizer solution 2.5 mg
2.5 | Freq: Once | RESPIRATORY_TRACT | Status: AC
Start: 2015-04-30 — End: 2015-04-30
  Administered 2015-04-30: 16:00:00 2.5 mg via RESPIRATORY_TRACT

## 2015-04-30 MED ORDER — methacholine (PROVOCHOLINE) challenge test
Freq: Once | RESPIRATORY_TRACT | Status: AC
Start: 2015-04-30 — End: 2015-04-30
  Administered 2015-04-30: 16:00:00 1 via RESPIRATORY_TRACT

## 2015-04-30 MED ORDER — sodium chloride 0.9 % nebulizer solution 3 mL
0.9 | Freq: Once | RESPIRATORY_TRACT | Status: AC
Start: 2015-04-30 — End: 2015-04-30
  Administered 2015-04-30: 15:00:00 3 mL via RESPIRATORY_TRACT

## 2015-04-30 MED FILL — SODIUM CHLORIDE 0.9 % FOR NEBULIZATION: 0.9 0.9 % | RESPIRATORY_TRACT | Qty: 3

## 2015-04-30 MED FILL — METHACHOLINE (PROVOCHOLINE) CHALLENGE TEST: 1.00 1.00 each | RESPIRATORY_TRACT | Qty: 1

## 2015-04-30 MED FILL — ALBUTEROL SULFATE 2.5 MG/3 ML (0.083 %) SOLUTION FOR NEBULIZATION: 2.5 2.5 mg /3 mL (0.083 %) | RESPIRATORY_TRACT | Qty: 3

## 2015-04-30 NOTE — Unmapped (Signed)
[place] [facility]    Pulmonary Function Tests Interpretation    Rebecca Le is a 55 y.o. female who had a pulmonary function test performed at [facility].    Methacholine challenge is negative.  There was no bronchospastic response to the highest dose of 16 mg.ml of methacholine administered.    Read performed by:  Metro Kung, MD 05/04/2015 1:20 PM

## 2015-05-15 DIAGNOSIS — E041 Nontoxic single thyroid nodule: Secondary | ICD-10-CM | POA: Insufficient documentation

## 2015-05-27 DIAGNOSIS — Z90710 Acquired absence of both cervix and uterus: Secondary | ICD-10-CM | POA: Insufficient documentation

## 2015-05-27 DIAGNOSIS — Z9889 Other specified postprocedural states: Secondary | ICD-10-CM | POA: Insufficient documentation

## 2015-06-08 ENCOUNTER — Ambulatory Visit: Payer: PRIVATE HEALTH INSURANCE | Attending: Family

## 2015-08-08 IMAGING — RF DG SWALLOWING FUNCTION - NRPT [facility]
1 series · 18 of 24 positions shown · non-contrast
Comparison: none

[Series 1: run · 12 acquisitions, 18 frames shown]
[im 1/12]
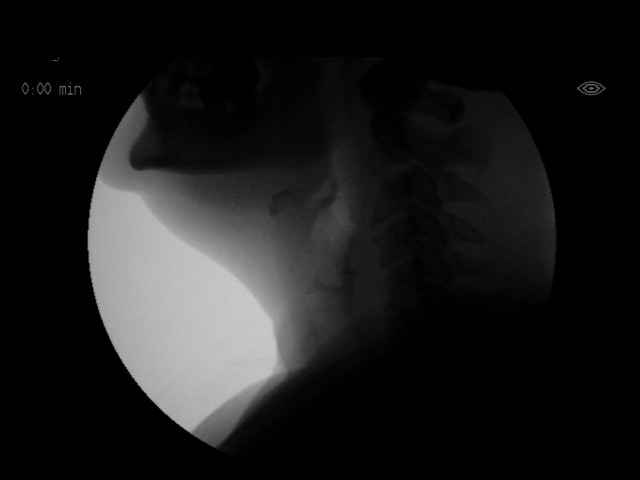
[im 2/12]
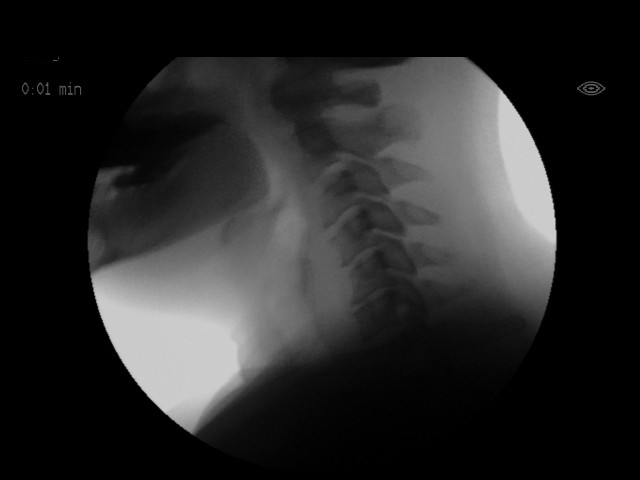
[im 2/12]
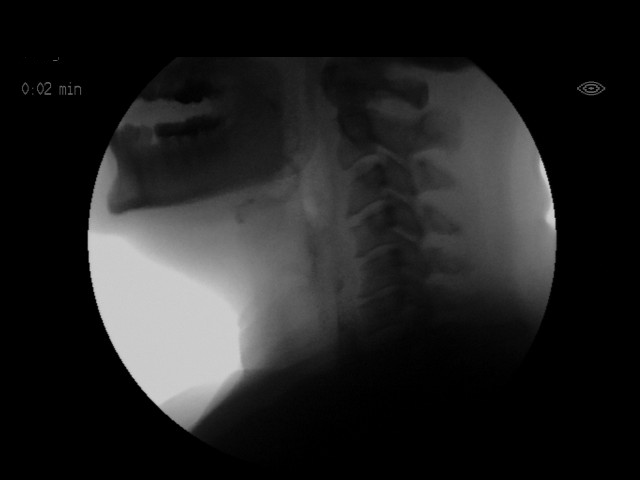
[im 3/12]
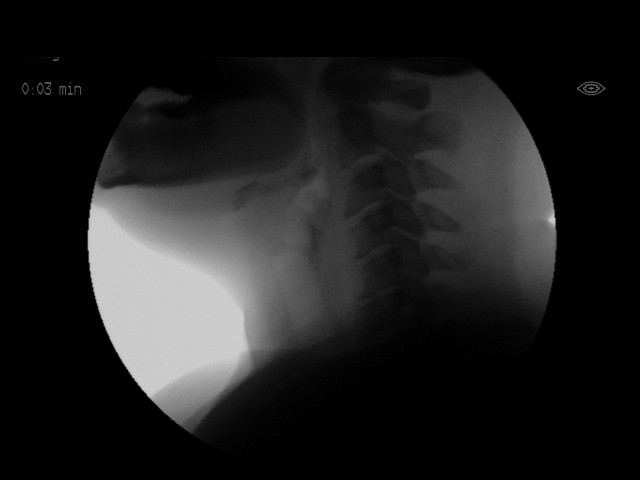
[im 4/12]
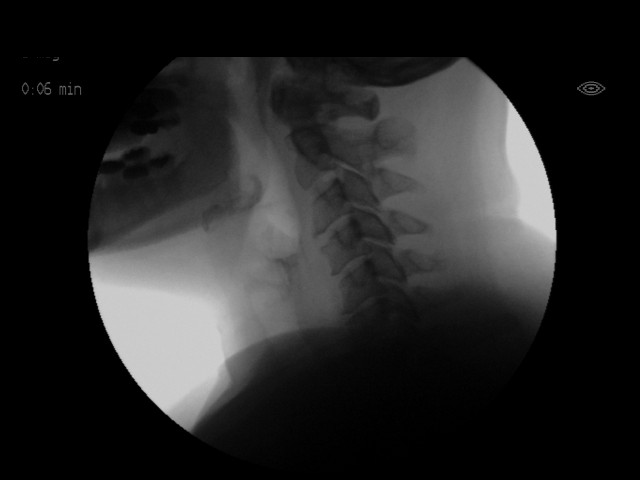
[im 4/12]
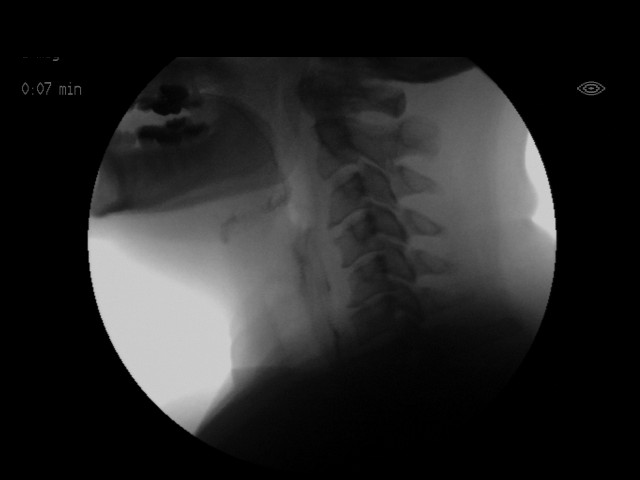
[im 5/12]
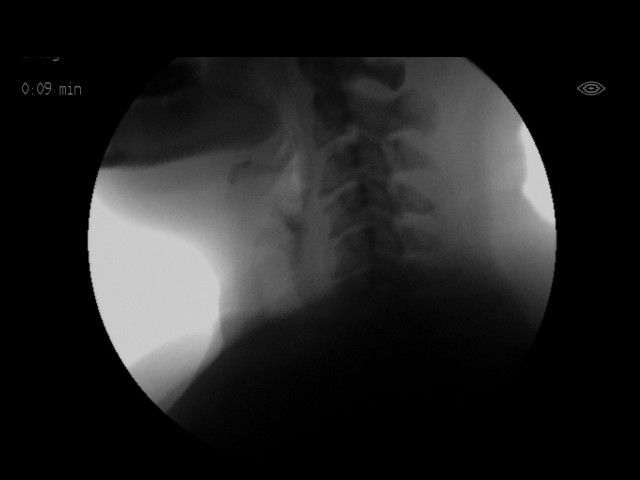
[im 6/12]
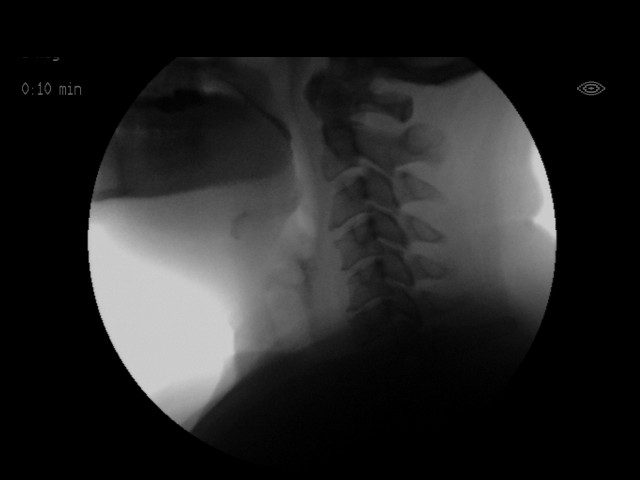
[im 6/12]
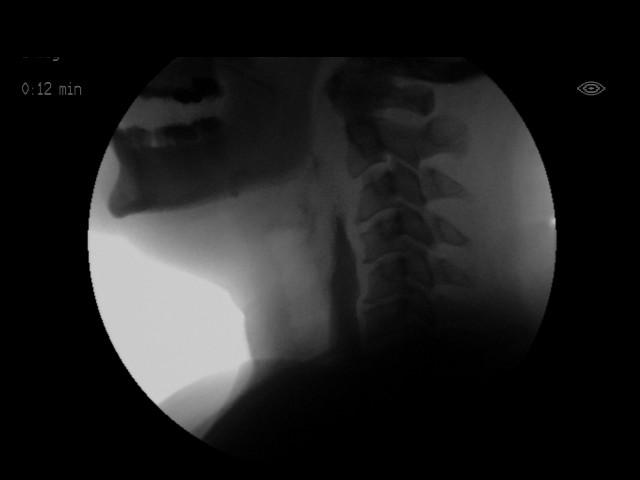
[im 7/12]
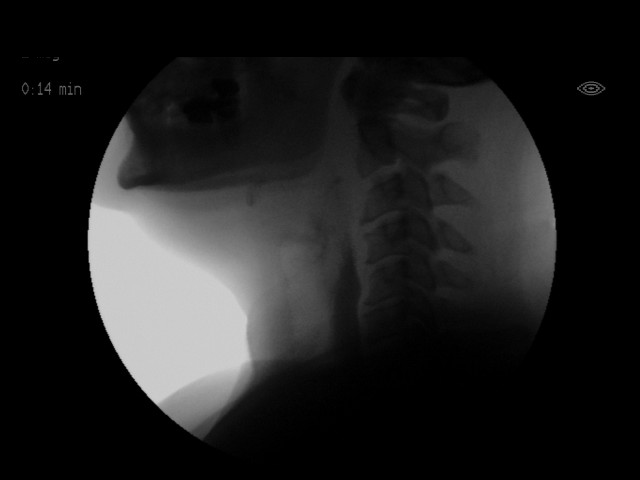
[im 8/12]
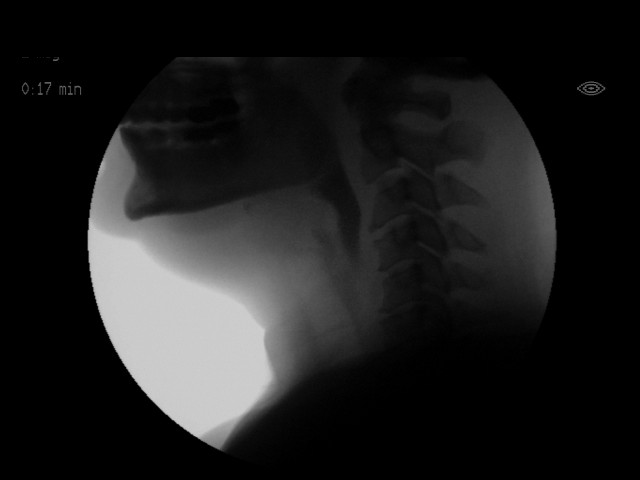
[im 8/12]
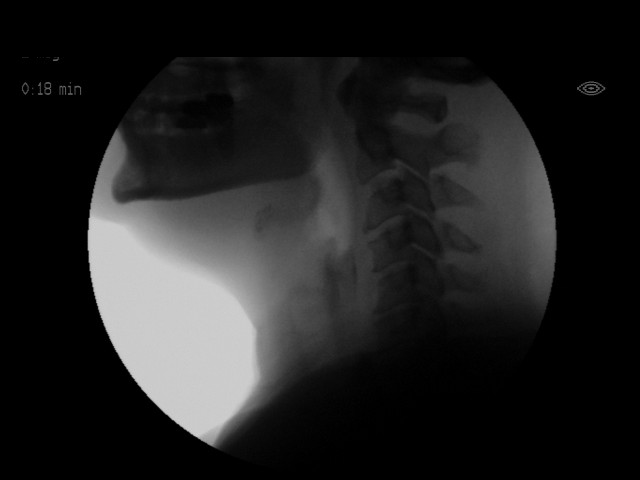
[im 9/12]
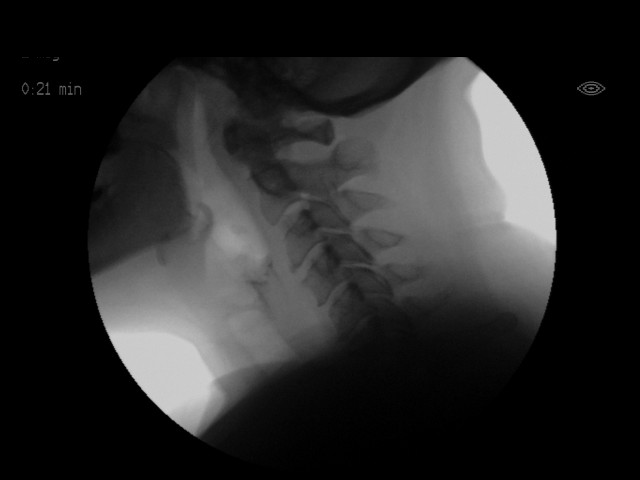
[im 10/12]
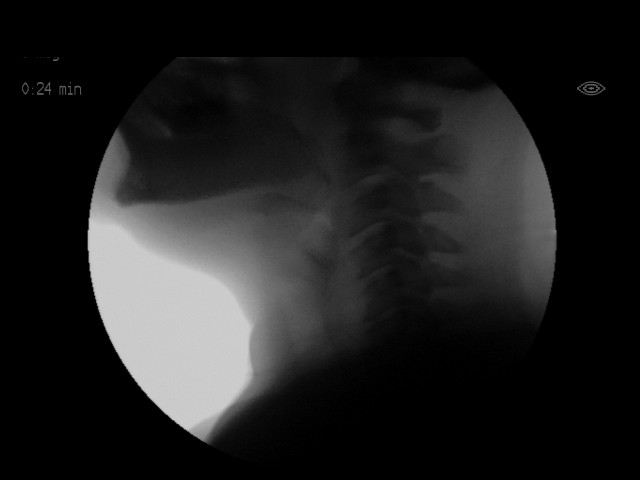
[im 10/12]
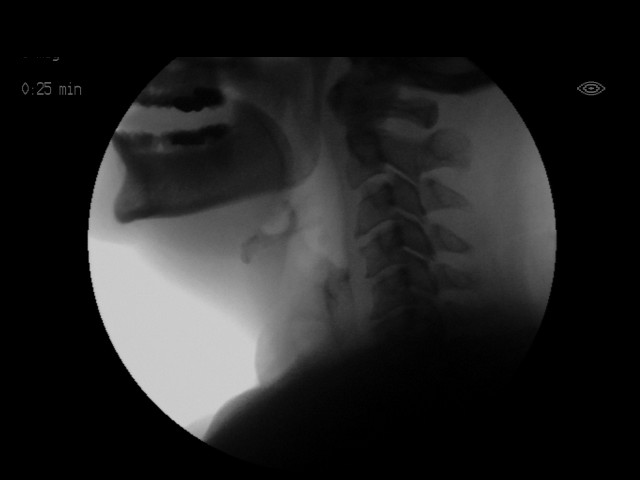
[im 11/12]
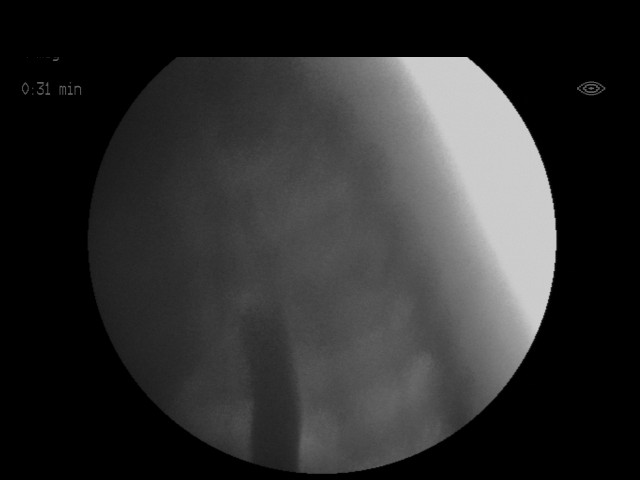
[im 12/12]
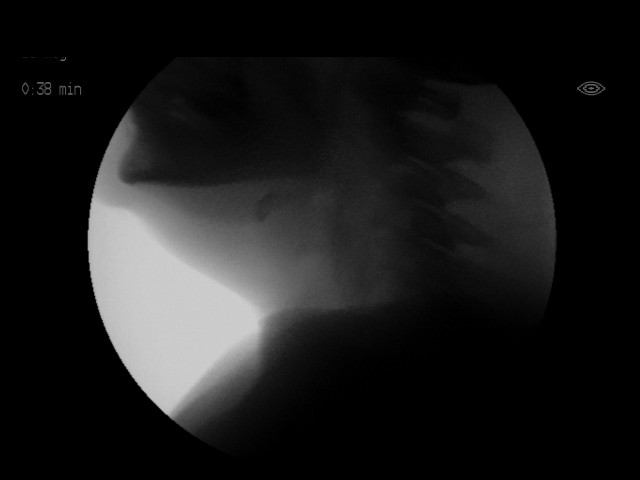
[im 12/12]
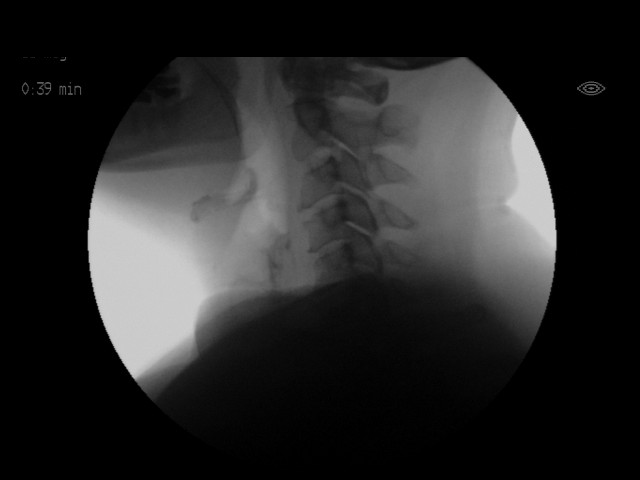

[18 of 24 positions shown; findings below may reference images not displayed]

Canned report from images found in remote index.

Refer to host system for actual result text.

## 2015-09-09 ENCOUNTER — Inpatient Hospital Stay: Admit: 2015-09-09 | Payer: PRIVATE HEALTH INSURANCE

## 2015-09-09 DIAGNOSIS — M4807 Spinal stenosis, lumbosacral region: Secondary | ICD-10-CM

## 2015-12-11 ENCOUNTER — Inpatient Hospital Stay: Admit: 2015-12-11 | Payer: PRIVATE HEALTH INSURANCE

## 2015-12-11 DIAGNOSIS — R9082 White matter disease, unspecified: Secondary | ICD-10-CM

## 2015-12-11 MED ORDER — GADAVIST (gadobutrol) Soln 10 mL
1 | Freq: Once | INTRAVENOUS | Status: AC | PRN
Start: 2015-12-11 — End: 2015-12-11
  Administered 2015-12-11: 23:00:00 10 mL/kg via INTRAVENOUS

## 2015-12-15 ENCOUNTER — Inpatient Hospital Stay: Admit: 2015-12-15

## 2016-01-06 ENCOUNTER — Ambulatory Visit
Admit: 2016-01-06 | Discharge: 2016-01-06 | Payer: PRIVATE HEALTH INSURANCE | Attending: Clinical Neurophysiology | Primary: Nurse Practitioner

## 2016-01-06 DIAGNOSIS — G3184 Mild cognitive impairment, so stated: Secondary | ICD-10-CM

## 2016-01-06 NOTE — Telephone Encounter (Signed)
Spoke to [place] to schedule LP for pt.  01/12/16 arrive at 1030 (LP at 11:30)   [facility] and to make sure an MS Panel was done.  Called patient and gave her the information as well.   Patient knows she is not allowed to DRIVE on day of LP.

## 2016-01-06 NOTE — Patient Instructions (Signed)
Please call with any questions or concerns:   [facility]  @ [phone].    LAB RESULTS:  Please obtain any labs or diagnostic tests as discussed today.   You may call the office to check the results.  Please allow  3 to 7 days for us to get these results.    MEDICATION LIST:  Please bring an accurate list of your medications to every visit.    APPOINTMENT CONFIRMATION:  We will call you the day before your scheduled appointment to confirm.   If we are unable to reach you, you MUST call back by the end of the day to confirm the appointment or we may be forced to cancel.

## 2016-01-06 NOTE — Progress Notes (Addendum)
NEUROLOGY CONSULTATION     Chief Complaint   Patient presents with   . Memory Loss     Patient is here today to establish care. Patient Started taking a weightloss supplement called Alli back in July and lost 14 lbs, but started having all kinds of memory issues and other neurological problems.       HISTORY OF PRESENT ILLNESS :    Crystal Santos is a 55 y.o. female who is referred by Dr. Sampson Goon   History was obtained from patient  Patient was referred for evaluation of some cognitive impairment and other neurological symptoms  Patient states that symptoms actually started about 8 years ago after she took over-the-counter weight loss drug called Alli.  Patient developed some neurological symptoms including poor coordination and cognitive impairment but the symptoms slowly improved.  Patient did not make the correction that the drug caused her symptoms.  She tried the same drug which is over-the-counter again several weeks ago and this time had significant symptoms.  She states that she was having speech impairment and very poor memory for short-term events.  She was confused at times and was not able to perform her work.  Symptoms are slightly improved in the last few weeks and now she is able to have a conversation.  [place]  which showed some mild chronic white matter changes.  Patient had a repeat MRI brain done at [facility] a few weeks ago which again showed the same white matter changes.  Symptom onset was subacute and moderately severe without any other aggravating or relieving factors  No focal weakness vertigo or diplopia      REVIEW OF SYSTEMS    Constitutional:  []    Chills   [x]   Fatigue   []   Fevers   []   Malaise   []   Weight loss     []  Denies all of the above    Eyes:  []   Double vision   [x]   Blurry vision     []  Denies all of the above    Ears, nose, mouth,  throat, and face:   []  Hearing loss    []    Hoarseness      []   Snoring    []   Tinnitus       [x]  Denies all of the above     Respiratory:   []   Cough    [x]   Shortness of breath         []  Denies all of the above     Cardiovascular:   []   Chest pain    []   Exertional chest pressure/discomfort           [x]  Palpitations    []   Syncope     []  Denies all of the above    Gastrointestinal:   [x]   Abdominal pain   []   Constipation    []   Diarrhea    []    Dysphagia                      []  Denies all of the above    Genitourinary:      []   Frequency   [x]   Hematuria     []   Urinary incontinence           []  Denies all of the above     Hematologic/lymphatic:  []   Bleeding    []   Easy bruising   []   Anemia  [x]  Denies all of the above     Musculoskeletal:   []  Back pain       []   Myalgias    []   Neck pain           [x]  Denies all of the above    Neurological: As noted in HPI    Behavioral/Psych:   []  Anxiety    []   Depression     []   Mood swings     [x]  Denies all of the above     Endocrine:   []   Temperature intolerance     [x]  Fatigue      []  Denies all of the above     Allergic/Immunologic:   [x]  Hay fever    []  Denies all of the above     No past medical history on file.  No family history on file.  Social History     Social History   . Marital status: Single     Spouse name: N/A   . Number of children: N/A   . Years of education: N/A     Social History Main Topics   . Smoking status: Never Smoker   . Smokeless tobacco: Never Used   . Alcohol use Yes      Comment: rarely   . Drug use: No   . Sexual activity: No     Other Topics Concern   . None     Social History Narrative   . None       PHYSICAL EXAMINATION:  BP (!) 140/91  Pulse 104  Ht 5\' 4"  (1.626 m)  Wt 224 lb (101.6 kg)  BMI 38.45 kg/m2  Appearance: Well appearing, well nourished and in no distress  Mental Status Exam: Patient is alert, oriented to person, place and time.   Recent and remote memory is normal  Fund of Knowledge is normal  Attention/concentration  is normal.   Speech : No dysarthria  Language : No aphasia  Funduscopic Exam: sharp disc margins  Cranial Nerves:   II: Visual fields:  Full to confrontation  III: Pupils:  equal, round, reactive to light  III,IV,VI: Extra Ocular Movements are intact. No nystagmus  V: Facial sensation is intact to pin prick and light touch  VII: Facial strength and movements: intact and symmetric smile,cheek puffing and eyebrow elevation  VIII: Hearing:  Intact to finger rub bilaterally  IX: Palate  elevation is symmetric  XI: Shoulder shrug is intact  XII: Tongue movements are normal  Motor:  Muscle tone and bulk are normal.   Strength is symmetrical 5/5 in all four extremities.  Sensory: Intact to light touch and  pin prick in all four extremities  Coordination:  Normal  Finger to Nose and Heel to Shin bilaterally    .Reflexes:  DTR +2 and symmetric bilaterally  Plantar response: Flexor bilaterally  Gait: Gait and station is normal. Patient can toe/ heel and tandem walk without difficulty  Romberg: negative  Vascular: No carotid bruit bilaterally        DATA:  MRI brain images were independently reviewed    IMPRESSION :  Patient with multiple neurological symptoms including cognitive difficulties and coordination problems  Neurological examination is nonfocal at this point  MRI brain images were independently reviewed and showed some nonspecific chronic white matter changes  Multiple sclerosis was felt to be less likely but cannot be totally ruled out  Metabolic conditions like hypothyroidism and B12 deficiency should be considered and  ruled out as well  I suspect that stress may play a big role in her symptoms as well  She is concerned about the side effects from the over-the-counter weight loss drug Alli.  I personally do not have much knowledge about this particular weight loss drug.  However if it is due to the drug, the symptoms should hopefully slowly improve since she has stopped taking the medication      RECOMMENDATIONS  :  Discussed at length with patient  I will get TSH and B12 levels checked  She should also get a CBC and metabolic profile  I will get a lumbar puncture done because of the white matter changes  I would also highly recommend a neuropsychology evaluation.  She will discuss with her primary care physician regarding this and should go to a provider who is in her insurance network  Thank you for this consultation        Please note a portion of this chart was generated using dragon dictation software. Although every effort was made to ensure the accuracy of this automated transcription, some errors in transcription may have occurred.

## 2016-01-07 ENCOUNTER — Inpatient Hospital Stay: Attending: Clinical Neurophysiology | Primary: Nurse Practitioner

## 2016-01-07 DIAGNOSIS — G3184 Mild cognitive impairment, so stated: Secondary | ICD-10-CM

## 2016-01-07 LAB — CBC WITH AUTO DIFFERENTIAL
Basophils %: 1.1 %
Basophils Absolute: 0.1 10*3/uL (ref 0.0–0.2)
Eosinophils %: 4.8 %
Eosinophils Absolute: 0.5 10*3/uL (ref 0.0–0.6)
Hematocrit: 41.6 % (ref 36.0–48.0)
Hemoglobin: 13.4 g/dL (ref 12.0–16.0)
Lymphocytes %: 20.2 %
Lymphocytes Absolute: 2.3 10*3/uL (ref 1.0–5.1)
MCH: 27.3 pg (ref 26.0–34.0)
MCHC: 32.1 g/dL (ref 31.0–36.0)
MCV: 85 fL (ref 80.0–100.0)
MPV: 9.6 fL (ref 5.0–10.5)
Monocytes %: 7.1 %
Monocytes Absolute: 0.8 10*3/uL (ref 0.0–1.3)
Neutrophils %: 66.8 %
Neutrophils Absolute: 7.5 10*3/uL (ref 1.7–7.7)
Platelets: 355 10*3/uL (ref 135–450)
RBC: 4.9 M/uL (ref 4.00–5.20)
RDW: 15.1 % (ref 12.4–15.4)
WBC: 11.2 10*3/uL — ABNORMAL HIGH (ref 4.0–11.0)

## 2016-01-07 LAB — COMPREHENSIVE METABOLIC PANEL
ALT: 12 U/L (ref 10–40)
AST: 12 U/L — ABNORMAL LOW (ref 15–37)
Albumin/Globulin Ratio: 1.4 (ref 1.1–2.2)
Albumin: 3.8 g/dL (ref 3.4–5.0)
Alkaline Phosphatase: 59 U/L (ref 40–129)
Anion Gap: 13 (ref 3–16)
BUN: 13 mg/dL (ref 7–20)
CO2: 28 mmol/L (ref 21–32)
Calcium: 9.2 mg/dL (ref 8.3–10.6)
Chloride: 98 mmol/L — ABNORMAL LOW (ref 99–110)
Creatinine: 0.6 mg/dL (ref 0.6–1.1)
GFR African American: >60 (ref >60)
GFR Non-African American: >60 (ref >60)
Globulin: 2.8 g/dL
Glucose: 107 mg/dL — ABNORMAL HIGH (ref 70–99)
Potassium: 4 mmol/L (ref 3.5–5.1)
Sodium: 139 mmol/L (ref 136–145)
Total Bilirubin: <0.2 mg/dL (ref 0.0–1.0)
Total Protein: 6.6 g/dL (ref 6.4–8.2)

## 2016-01-07 LAB — VITAMIN B12: Vitamin B-12: 1390 pg/mL — ABNORMAL HIGH (ref 211–911)

## 2016-01-07 LAB — TSH: TSH: 1.57 u[IU]/mL (ref 0.27–4.20)

## 2016-01-08 NOTE — Telephone Encounter (Signed)
Patient's employer called yesterday stating they were going to pay patient 100% while she was off work (They have her off work). Wanted Dr. Glean Hessajan to complete forms to cover her till next appt 02/05/16.  Dr Glean Hessajan is unable to complete forms because he has only seen her one time and does not even have a diagnosis for the patient.  Will be happy to send the consultation note and dates of any future visits and testing.

## 2016-01-08 NOTE — Progress Notes (Signed)
Message left instructing on procedure, arrival time, npo status, meds with restrictions, and transportation.  Office number left for pt to return call.

## 2016-01-12 ENCOUNTER — Inpatient Hospital Stay: Admit: 2016-01-12 | Primary: Nurse Practitioner

## 2016-01-12 ENCOUNTER — Inpatient Hospital Stay: Attending: Clinical Neurophysiology | Primary: Nurse Practitioner

## 2016-01-12 DIAGNOSIS — R930 Abnormal findings on diagnostic imaging of skull and head, not elsewhere classified: Secondary | ICD-10-CM

## 2016-01-12 LAB — PROTIME-INR
INR: 0.98 (ref 0.85–1.15)
Protime: 11.1 s (ref 9.6–13.0)

## 2016-01-12 LAB — CELL COUNT WITH DIFFERENTIAL, CSF
RBC, CSF: 9 /mm3 — AB
Volume Csf: 3 mL
WBC, CSF: 4 /mm3 (ref 0–5)

## 2016-01-12 LAB — PROTEIN, CSF: Protein, CSF: 42 mg/dL (ref 15–45)

## 2016-01-12 LAB — GLUCOSE, CSF: Glucose, CSF: 69 mg/dL (ref 40–80)

## 2016-01-12 LAB — APTT: aPTT: 29.8 s (ref 24.1–34.9)

## 2016-01-12 MED ORDER — ACETAMINOPHEN-CODEINE #2 300-15 MG PO TABS
300-15 MG | Freq: Once | ORAL | Status: DC
Start: 2016-01-12 — End: 2016-01-12

## 2016-01-12 MED ORDER — LIDOCAINE HCL (PF) 1 % IJ SOLN
1 % | Freq: Once | INTRAMUSCULAR | Status: DC
Start: 2016-01-12 — End: 2016-01-12

## 2016-01-12 MED ORDER — ACETAMINOPHEN 325 MG PO TABS
325 MG | Freq: Once | ORAL | Status: AC
Start: 2016-01-12 — End: 2016-01-12
  Administered 2016-01-12: 18:00:00 650 mg via ORAL

## 2016-01-12 MED FILL — ACETAMINOPHEN 325 MG PO TABS: 325 MG | ORAL | Qty: 2

## 2016-01-12 NOTE — Progress Notes (Signed)
Pt arrived to me in same day surgery for phase 2 recovery monitoring and care prior to d/c from Radiology/Special Procedures in good condition.  Pt is alert and oriented X4.  Report received from Radiology RN.    S/p lumbar puncture   Dressing: DSD CDI  Pt may leave per MD order: unclear, called to clarify orders.      Jasia Hiltunen Orvan Falconerampbell RN, BSN, [facility]  Pre-Op/Recovery   Same Day Surgery

## 2016-01-12 NOTE — Progress Notes (Signed)
May leave at 1500 per MD  Pt updated.  Ride notified.

## 2016-01-12 NOTE — Progress Notes (Signed)
Pt discharged home per private vehicle with family who states they will be with him for the next 24 hours.  Wheeled to front of the hospital.

## 2016-01-12 NOTE — Progress Notes (Signed)
Teaching / education initiated regarding perioperative experience, expectations, and pain management during stay. Patient verbalized understanding.  Here for LP.  A friend will drive her home.  Needs to be called one half hour before d/c time.  Phone number: Melvenia BeamShari [phone]    Brandilynn Taormina Orvan Falconerampbell RN, BSN, [facility]  Pre-Op/Recovery   Same Day Surgery

## 2016-01-12 NOTE — Discharge Instructions (Signed)
Follow up with Dr. Glean Hessajan with any questions, concerns, results, and an appointment after you procedure in the time period recommended.   [phone]      Rest quietly for the rest of the day.  Resume your usual diet and medications unless otherwise instructed.  Increase your fluid intake.  Take a mild pain medication for head or backache, Tylenol.  Call your physician if you have a severe headache and/or extreme nausea and vomiting.  Do not lift more than 10 pounds for 24 hours.  You may shower after 24 hours and remove the bandage.

## 2016-01-13 NOTE — Other (Signed)
Pt states no concerns or complications noted.

## 2016-01-13 NOTE — Telephone Encounter (Addendum)
Hosp called; they did NOT draw the SST tube for the MS Panel.  Patient needs to return to hosp ([facility]) for additional lab work.  She needs to return TODAY or Thurs at the latest.  Had to leave message for patient.    Pt has not called back: Called her again at 2:35PM and had to leave another message.    Pt returned call at 2:45PM.  Explained the need to return to hosp for additional lab work.  She has PCP appt now,but will stop there around 5PM

## 2016-01-15 LAB — MULTIPLE SCLEROSIS PROFILE
Albumin, CSF: 23.1 mg/dL (ref 10.0–30.0)
Albumin: 3949 mg/dL (ref 3400–5000)
IgG Index: 0.54 (ref 0.20–0.70)
IgG Synthesis Rate, CSF: -0.7 mg/d (ref <=3.3)
IgG, CSF: 2.2 mg/dL (ref 0.00–6.00)
IgG: 696 mg/dL — ABNORMAL LOW (ref 700.0–1600.0)
Oligo Bands: 0
Oligoclonal Bands, CSF: 0
Protein, CSF: 42 mg/dL (ref 15–45)

## 2016-01-15 LAB — PATH INTERP ELEC CSF

## 2016-01-19 ENCOUNTER — Encounter: Payer: Self-pay | Admitting: Psychiatry

## 2016-01-21 NOTE — Telephone Encounter (Signed)
Melvenia BeamShari called to get LP results.  Explained to her that we had already spoken to patient and told her they were negative for MS.   She requested something in writing to that effect.   (in media)     She said patient has moved back home and I asked her to let me know if the patient will be keeping the Oct follow up.

## 2016-02-05 ENCOUNTER — Encounter: Attending: Clinical Neurophysiology | Primary: Nurse Practitioner

## 2016-02-25 NOTE — Telephone Encounter (Signed)
[facility] and he would like copies of all notes and results.   Explained that I had to check with [facility] to see if copies of My-Chart communications could be included.  02/25/16, Per Arlyss GandyGrace Nelson, they cannot be released with a subpoena of sorts.   FAXED notes and results to Melvenia BeamShari at [phone] along with signed ROI from patient.    Also mailed copies of notes and results to patient after receiving her signed [place] ROI form in the mail.

## 2016-05-03 NOTE — Progress Notes (Deleted)
Psychiatric Initial Adult Assessment   Patient Identification: Michele Lynch MRN:  [id number] Date of Evaluation:  05/03/2016 Referral Source: *** Chief Complaint:   Visit Diagnosis: No diagnosis found.  History of Present Illness:  ***  ? MS  Associated Signs/Symptoms: Depression Symptoms:  {DEPRESSION SYMPTOMS:20000} (Hypo) Manic Symptoms:  {BHH MANIC SYMPTOMS:22872} Anxiety Symptoms:  {BHH ANXIETY SYMPTOMS:22873} Psychotic Symptoms:  {BHH PSYCHOTIC SYMPTOMS:22874} PTSD Symptoms: {BHH PTSD SYMPTOMS:22875}  Past Psychiatric History: ***  Previous Psychotropic Medications: {YES/NO:21197}  Substance Abuse History in the last 12 months:  {yes no:314532}  Consequences of Substance Abuse: {BHH CONSEQUENCES OF SUBSTANCE ABUSE:22880}  Past Medical History:  Past Medical History:  Diagnosis Date  . Allergy   . History of bladder infections   . Personal history of colonic polyps - pre-cancerous 01/01/2013  . Pre-diabetes   . PVC (premature ventricular contraction)     Past Surgical History:  Procedure Laterality Date  . [place]  . ESOPHAGOGASTRODUODENOSCOPY  2007   Gessner (gastropathy, esophagitis - GERD vs. EE)  . NASAL SINUS SURGERY    . TONSILLECTOMY  1972  . VAGINAL HYSTERECTOMY  1995    Family Psychiatric History: ***  Family History:  Family History  Problem Relation Age of Onset  . Colon cancer Neg Hx   . Arthritis Mother   . COPD Mother   . Cancer Father     leukemia  . Heart disease Sister     PACs  . Diabetes Brother   . Gout Brother   . Multiple sclerosis Paternal Aunt     Social History:   Social History   Social History  . Marital status: Divorced    Spouse name: N/A  . Number of children: N/A  . Years of education: N/A   Social History Main Topics  . Smoking status: Never Smoker  . Smokeless tobacco: Never Used  . Alcohol use No  . Drug use: No  . Sexual activity: Not on file   Other Topics Concern  . Not  on file   Social History Narrative   Work or School: works for [employer]: lives alone      Spiritual Beliefs: [employer]      Lifestyle: walking 5 days; diet is "good"             Additional Social History: ***  Allergies:  No Known Allergies  Metabolic Disorder Labs: No results found for: HGBA1C, MPG No results found for: PROLACTIN No results found for: CHOL, TRIG, HDL, CHOLHDL, VLDL, LDLCALC   Current Medications: Current Outpatient Prescriptions  Medication Sig Dispense Refill  . Cholecalciferol (VITAMIN D3) 2000 UNITS TABS Take by mouth daily.    Marland Kitchen estradiol (ESTRACE) 1 MG tablet Take 1 mg by mouth daily.    . furosemide (LASIX) 20 MG tablet      No current facility-administered medications for this visit.     Neurologic: Headache: No Seizure: No Paresthesias:No  Musculoskeletal: Strength & Muscle Tone: within normal limits Gait & Station: normal Patient leans: N/A  Psychiatric Specialty Exam: ROS  There were no vitals taken for this visit.There is no height or weight on file to calculate BMI.  General Appearance: Fairly Groomed  Eye Contact:  Good  Speech:  Clear and Coherent  Volume:  Normal  Mood:  {BHH MOOD:22306}  Affect:  {Affect (PAA):22687}  Thought Process:  Coherent and Goal Directed  Orientation:  Full (Time, Place, and Person)  Thought Content:  Logical  Suicidal Thoughts:  {ST/HT (PAA):22692}  Homicidal Thoughts:  {ST/HT (PAA):22692}  Memory:  Immediate;   Good Recent;   Good Remote;   Good  Judgement:  {Judgement (PAA):22694}  Insight:  {Insight (PAA):22695}  Psychomotor Activity:  Normal  Concentration:  Concentration: Good and Attention Span: Good  Recall:  Good  Fund of Knowledge:Good  Language: Good  Akathisia:  No  Handed:  Right  AIMS (if indicated):  N/A  Assets:  Communication Skills Desire for Improvement  ADL's:  Intact  Cognition: WNL  Sleep:  ***   Assessment  Plan  The patient demonstrates  the following risk factors for suicide: Chronic risk factors for suicide include: {Chronic Risk Factors for HD:3327074. Acute risk factors for suicide include: {Acute Risk Factors for NL:6244280. Protective factors for this patient include: {Protective Factors for Suicide FR:7288263. Considering these factors, the overall suicide risk at this point appears to be {Desc; low/moderate/high:110033}. Patient {ACTION; IS/IS GI:087931 appropriate for outpatient follow up.   Treatment Plan Summary: {CHL AMB [facility] MD [place] YF:318605   Norman Clay, MD 1/16/201812:26 PM

## 2016-05-05 ENCOUNTER — Ambulatory Visit (HOSPITAL_COMMUNITY): Payer: 59 | Admitting: Psychiatry

## 2016-05-09 ENCOUNTER — Encounter: Payer: Self-pay | Admitting: Family Medicine
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
## 2016-05-24 ENCOUNTER — Encounter (HOSPITAL_COMMUNITY): Payer: Self-pay | Admitting: Psychiatry

## 2016-05-24 ENCOUNTER — Ambulatory Visit (INDEPENDENT_AMBULATORY_CARE_PROVIDER_SITE_OTHER): Payer: 59 | Admitting: Psychiatry

## 2016-05-24 ENCOUNTER — Encounter (INDEPENDENT_AMBULATORY_CARE_PROVIDER_SITE_OTHER): Payer: Self-pay

## 2016-05-24 VITALS — BP 135/87 | HR 88 | Ht 64.0 in | Wt 219.8 lb

## 2016-05-24 DIAGNOSIS — Z833 Family history of diabetes mellitus: Secondary | ICD-10-CM

## 2016-05-24 DIAGNOSIS — Z9889 Other specified postprocedural states: Secondary | ICD-10-CM | POA: Diagnosis not present

## 2016-05-24 DIAGNOSIS — Z888 Allergy status to other drugs, medicaments and biological substances status: Secondary | ICD-10-CM

## 2016-05-24 DIAGNOSIS — Z806 Family history of leukemia: Secondary | ICD-10-CM | POA: Diagnosis not present

## 2016-05-24 DIAGNOSIS — Z90710 Acquired absence of both cervix and uterus: Secondary | ICD-10-CM | POA: Diagnosis not present

## 2016-05-24 DIAGNOSIS — F4320 Adjustment disorder, unspecified: Secondary | ICD-10-CM | POA: Diagnosis not present

## 2016-05-24 DIAGNOSIS — Z8261 Family history of arthritis: Secondary | ICD-10-CM

## 2016-05-24 DIAGNOSIS — Z79899 Other long term (current) drug therapy: Secondary | ICD-10-CM

## 2016-05-24 DIAGNOSIS — Z8249 Family history of ischemic heart disease and other diseases of the circulatory system: Secondary | ICD-10-CM

## 2016-05-24 MED ORDER — ARIPIPRAZOLE 5 MG PO TABS: 5.0000 mg | ORAL_TABLET | Freq: Every day | ORAL | 0 refills | Status: DC

## 2016-05-24 NOTE — Patient Instructions (Signed)
1. Increase Abilify 5 mg daily 2. Return to clinic in one month

## 2016-05-26 ENCOUNTER — Telehealth (HOSPITAL_COMMUNITY): Payer: Self-pay | Admitting: *Deleted

## 2016-05-26 ENCOUNTER — Encounter (HOSPITAL_COMMUNITY): Payer: Self-pay | Admitting: *Deleted

## 2016-05-26 NOTE — Telephone Encounter (Signed)
Per Verbal from Dr. Modesta Messing to call pt and inform her that she needs to come by office to sign a release for office to get records from Dr. Vita Erm office in [facility]. Called pt and informed her of this and she verbalized understanding and stated she will try to come by office either on 05-27-2016 or 05-30-2016 to fill out release.

## 2016-05-26 NOTE — Telephone Encounter (Signed)
Pt walked into office today and completed a release for [employer]. Per pt she would like for Dr. Modesta Messing to review some records she have with her. Records were token from pt and put in provider's box.

## 2016-06-10 ENCOUNTER — Telehealth (HOSPITAL_COMMUNITY): Payer: Self-pay | Admitting: *Deleted

## 2016-06-10 NOTE — Telephone Encounter (Signed)
phone call from patient, said she left some forms to be completed and wants to know if they are ready to be picked up.

## 2016-06-10 NOTE — Telephone Encounter (Signed)
Called pt and informed her that pt verbal from Dr. Modesta Messing that her form she dropped off will be completed during visit. Informed pt that per Dr. Modesta Messing, a fax came from the provider office requested records from and they stated pt was not a patient of theirs and named a provider that might have seen pt when she was coming to their office. Informed pt that per verbal from Dr. Modesta Messing, when she comes in a release will have to be completed for the new provider stated on that fax and form will be completed when she returns to office for a f/u. Pt verbalized understanding. Pt then stated she have up to March 18th to be out of work and would like to know what to do. Per pt her work wants her to return to work March 19th or she will be fired. Per pt so she has to have her disability paperwork approved before then. Pt number is [phone]. Per pt she had some severe cognitive episodes and they made her leave work on September 19th, 2017.

## 2016-06-13 NOTE — Telephone Encounter (Signed)
I will not be able to fill out any paperwork for disability at this point, as I have seen the patient only once and has very limited information/collaterals.   I first need to obtain full report written by Dr. Ila Mcgill. Eugenio Hoes, Ph.D. for fitness for duty evaluation. Please ask the patient if she can sign for this information to be released. (I will not be able to fill any paperwork without this report) Also please ask the patient if she is amenable to change her appointment to 30 mins (not 15 mins) and brings one of her family member who knows her well for collateral.

## 2016-06-13 NOTE — Telephone Encounter (Signed)
Spoke with pt per previous phone call. Informed pt with what provider stated and she verbalized understanding. Per pt she do not think Dr. Yvetta Coder office will release information to office because she they told her they wouldn't. Per pt she have a copy of the report but there are informations that are blacked out and if office wanted that copy. Informed pt that office can not have that copy and release that she filled out earlier that is on file will be faxed to his office and office can only received records via fax with no blacked out information. Pt verbalized understanding. RMA informed pt that once office receive pt records from Dr. Eugenio Hoes office, an appt with then be made. Pt also verbalized understanding.

## 2016-06-14 NOTE — Telephone Encounter (Signed)
Spoke with pt and informed her that Dr. Modesta Messing would like for her to sign a release of information for [employer] account planner. Pt verbalized understanding and stated she will come by office on Wednesday to sign release. RMA put form back in providers box

## 2016-06-14 NOTE — Telephone Encounter (Signed)
It seems like we need to contact the other office to get fitness for duty evaluation. Please ask the patient to sign for release of record as we discussed.

## 2016-06-15 ENCOUNTER — Telehealth (HOSPITAL_COMMUNITY): Payer: Self-pay | Admitting: *Deleted

## 2016-06-15 ENCOUNTER — Other Ambulatory Visit (HOSPITAL_COMMUNITY): Payer: Self-pay | Admitting: Psychiatry

## 2016-06-15 ENCOUNTER — Telehealth (HOSPITAL_COMMUNITY): Payer: Self-pay | Admitting: Psychiatry

## 2016-06-15 MED ORDER — ARIPIPRAZOLE 5 MG PO TABS: 5.0000 mg | ORAL_TABLET | Freq: Every day | ORAL | 2 refills | Status: DC

## 2016-06-15 NOTE — Telephone Encounter (Signed)
Received a record for neuropsychological fitness for duty evaluation written by D. Yvetta Coder, Ph.D. 02/17/2016. Details in the scan.  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 There is also a concern raised for possible sleep apnea, and it may be beneficial for her to undergo routine clinical neuropsychological evaluation  # Unspecified schizophrenia spectrum and other psychotic disorder  -Previously diagnosed with schizoaffective disorder by her PCP, Dr. Gar Ponto, first around the time of her divorce approximately in 2007. She has a long history of delusion, disorganized thought and behavior, and somatic hallucinations. Her father has been diagnosed with schizophrenia. Recommended to see psychiatrist for diagnostic work up, treatment and psychosocial rehabilitation service to regain the skills needed to work and live successfully. Any sudden or forced return to work I likely to elicit the same kinds of stressors that initiated her current psychiatric decompensation.

## 2016-06-15 NOTE — Telephone Encounter (Signed)
Pt came into office to sign release for both Alinda Money, RN and [employer] with [employer]. Release where both scanned in system and faxed to them on 06-15-2016. Pt verbalized and showed understanding.

## 2016-06-22 ENCOUNTER — Ambulatory Visit (INDEPENDENT_AMBULATORY_CARE_PROVIDER_SITE_OTHER): Payer: 59 | Admitting: Psychiatry

## 2016-06-22 ENCOUNTER — Encounter (HOSPITAL_COMMUNITY): Payer: Self-pay | Admitting: Psychiatry

## 2016-06-22 VITALS — BP 119/84 | HR 81 | Ht 64.0 in | Wt 223.0 lb

## 2016-06-22 DIAGNOSIS — Z79899 Other long term (current) drug therapy: Secondary | ICD-10-CM | POA: Diagnosis not present

## 2016-06-22 DIAGNOSIS — F4320 Adjustment disorder, unspecified: Secondary | ICD-10-CM

## 2016-06-22 DIAGNOSIS — Z888 Allergy status to other drugs, medicaments and biological substances status: Secondary | ICD-10-CM

## 2016-06-22 DIAGNOSIS — F209 Schizophrenia, unspecified: Secondary | ICD-10-CM | POA: Diagnosis not present

## 2016-06-22 MED ORDER — ARIPIPRAZOLE 5 MG PO TABS: 5.0000 mg | ORAL_TABLET | Freq: Every day | ORAL | 0 refills | Status: DC

## 2016-06-22 NOTE — Progress Notes (Deleted)
BH MD/PA/NP OP Progress Note  06/22/2016 12:02 PM Michele Lynch  MRN:  [id number]  Chief Complaint:  Subjective:  *** HPI:   Lipid, HbA1c  Received a record for neuropsychological fitness for duty evaluation written by D. Yvetta Coder, Ph.D. 02/17/2016. Details in the scan.  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 There is also a concern raised for possible sleep apnea, and it may be beneficial for her to undergo routine clinical neuropsychological evaluation  # Unspecified schizophrenia spectrum and other psychotic disorder  -Previously diagnosed with schizoaffective disorder by her PCP, Dr. Gar Ponto, first around the time of her divorce approximately in 2007. She has a long history of delusion, disorganized thought and behavior, and somatic hallucinations. Her father has been diagnosed with schizophrenia. Recommended to see psychiatrist for diagnostic work up, treatment and psychosocial rehabilitation service to regain the skills needed to work and live successfully. Any sudden or forced return to work I likely to elicit the same kinds of stressors that initiated her current psychiatric decompensation.   Visit Diagnosis: No diagnosis found.  Past Psychiatric History:  Outpatient: saw [place] provider when she got divorced Psychiatry admission: denies Previous suicide attempt: denies Past trials of medication: Abilify for 3.5 months History of violence: denies  Past Medical History:  Past Medical History:  Diagnosis Date  . Allergy   . History of bladder infections   . Personal history of colonic polyps - pre-cancerous 01/01/2013  . Pre-diabetes   . PVC (premature ventricular contraction)     Past Surgical History:   Procedure Laterality Date  . [place]  . ESOPHAGOGASTRODUODENOSCOPY  2007   Gessner (gastropathy, esophagitis - GERD vs. EE)  . NASAL SINUS SURGERY    . TONSILLECTOMY  1972  . VAGINAL HYSTERECTOMY  1995    Family Psychiatric History:  Father- schizophrenia, PTSD, brother- PTSD, brother's daughter- autistic, Paternal aunt- MS, paternal aunt's son- MG, sister's son- epilepsy, maternal uncle- epilepsy  Family History:  Family History  Problem Relation Age of Onset  . Arthritis Mother   . COPD Mother   . Cancer Father     leukemia  . Multiple sclerosis Paternal Aunt   . Heart disease Sister     PACs  . Diabetes Brother   . Gout Brother   . Colon cancer Neg Hx     Social History:  Social History   Social History  . Marital status: Divorced    Spouse name: N/A  . Number of children: N/A  . Years of education: N/A   Social History Main Topics  . Smoking status: Never Smoker  . Smokeless tobacco: Never Used  . Alcohol use No     Comment: 05-24-2016 rarely  . Drug use: No     Comment: 05-24-2016 per pt no  . Sexual activity: Not on file   Other Topics Concern  . 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 Was in [place] for 1.5 year and moved back to [place] while FMLA.  Allergies:  Allergies  Allergen Reactions  . Orlistat Other (See Comments)    Cognitive problems and neurological problems( memory, speaking, Heart PVC, coordination, vision, impaired discission making)    Metabolic Disorder Labs: No results found for: HGBA1C, MPG No results found for: PROLACTIN No results found for: CHOL, TRIG, HDL, CHOLHDL, VLDL, LDLCALC   Current Medications: Current Outpatient Prescriptions  Medication Sig  Dispense Refill  . ARIPiprazole (ABILIFY) 5 MG tablet Take 1 tablet (5 mg total) by mouth daily. 30 tablet 2  . Cholecalciferol (VITAMIN D3) 2000 UNITS TABS Take by mouth daily.    Marland Kitchen estradiol (ESTRACE) 1 MG tablet Take 1 mg by mouth daily.    . Multiple Vitamin (MULTIVITAMIN) capsule Take 1 capsule by mouth daily.     No current facility-administered medications for this visit.     Neurologic: Headache: No Seizure: No Paresthesias: No  Musculoskeletal: Strength & Muscle Tone: within normal limits Gait & Station: normal Patient leans: N/A  Psychiatric Specialty Exam: ROS  There were no vitals taken for this visit.There is no height or weight on file to calculate BMI.  General Appearance: Fairly Groomed  Eye Contact:  Good  Speech:  Clear and Coherent  Volume:  Normal  Mood:  {BHH MOOD:22306}  Affect:  {Affect (PAA):22687}  Thought Process:  Coherent and Goal Directed  Orientation:  Full (Time, Place, and Person)  Thought Content: Logical   Suicidal Thoughts:  {ST/HT (PAA):22692}  Homicidal Thoughts:  {ST/HT (PAA):22692}  Memory:  Immediate;   Good Recent;   Good Remote;   Good  Judgement:  {Judgement (PAA):22694}  Insight:  {Insight (PAA):22695}  Psychomotor Activity:  Normal  Concentration:  Concentration: Good and Attention Span: Good  Recall:  Good  Fund of Knowledge: Good  Language: Good  Akathisia:  No  Handed:  Right  AIMS (if indicated):  N/A  Assets:  Communication Skills Desire for Improvement  ADL's:  Intact  Cognition: WNL  Sleep:  ***   Assessment Michele Lynch is a 56 year old female with depression, dysphagia, who is referred by her PCP (referral reason unclear).   # Adjustment disorder with anxiety # r/o schizophrenia # r/o delusional disorder Today's evaluation is notable for her disorganized and tangential/circumstantial thought process. She also ruminates on paranoia about her work environment. It is unclear whether this is the first  onset psychosis; she appears to function well up until recently and denies any significant episodes in the past. Will obtain record from psychologist for further evaluation. Will increase Abilify to target her disorganization. No significant behavioral issues observed on today's evaluation.   Plan 1. Increase Abilify 5 mg daily 2. Return to clinic in one month 3. Obtain record from Dr. Vita Erm PhD, in [place] on 02/17/2016  The patient demonstrates the following risk factors for suicide: Chronic risk factors for suicide include: psychiatric disorder of r/o schizophrenia. Acute risk factors for suicide include: loss (financial, interpersonal, professional). Protective factors for this patient include: hope for the future. Considering these factors, the overall suicide risk at this point appears to be low. Patient is appropriate for outpatient follow up.   Treatment Plan Summary: Plan as above   Norman Clay, MD 06/22/2016, 12:02 PM

## 2016-06-22 NOTE — Progress Notes (Signed)
[facility] MD/PA/NP OP Progress Note  06/22/2016 3:31 PM MELANNY WIRE  MRN:  [id number]  Chief Complaint:  Chief Complaint    Follow-up; Schizophrenia     Subjective:  "I'm doing well" HPI:  Patient presents for follow up appointment. She goes to computer class with her son and visits her mother, age 55. She occasionally has "cognitive trouble" and has issues with her memory. When she is asked about the work environment, she talks about one co-worker she was troubled with, and she feels fine with others. When she is asked to elaborate it, she states that it is "changing automation of reporting means, and the local people don't have a say to it." She states that she works at [employer] and she looked at negative changes. She was "stuck in the middle of it" and "it was about the situation."    She denies feeling depressed, although she endorses fatigue. She denies SI, HI, AH/VH. She denies paranoia. She takes Abilify 5 mg without significant sie effect.   Reviewed a record for neuropsychological fitness for duty evaluation written by D. Yvetta Coder, Ph.D. 02/17/2016. Details in the scan.  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 There is also a concern raised for possible sleep apnea, and it may be beneficial for her to undergo routine clinical neuropsychological evaluation  # Unspecified schizophrenia spectrum and other psychotic disorder  -Previously diagnosed with schizoaffective disorder by her PCP, Dr. Gar Ponto, first around the time of her divorce approximately in 2007. She has a long history of delusion, disorganized thought and behavior, and somatic hallucinations. Her father has been diagnosed with schizophrenia. Recommended to see  psychiatrist for diagnostic work up, treatment and psychosocial rehabilitation service to regain the skills needed to work and live successfully. Any sudden or forced return to work I likely to elicit the same kinds of stressors that initiated her current psychiatric decompensation.   Visit Diagnosis:    ICD-9-CM ICD-10-CM   1. Adjustment disorder, unspecified type 309.9 F43.20   2. Schizophrenia, unspecified type ([place]) 295.90 F20.9     Past Psychiatric History:  Outpatient: saw [facility] provider when she got divorced Psychiatry admission: denies Previous suicide attempt: denies Past trials of medication: Abilify for 3.5 months History of violence: denies  Past Medical History:  Past Medical History:  Diagnosis Date  . Allergy   . History of bladder infections   . Personal history of colonic polyps - pre-cancerous 01/01/2013  . Pre-diabetes   . PVC (premature ventricular contraction)     Past Surgical History:  Procedure Laterality Date  . [place]  . ESOPHAGOGASTRODUODENOSCOPY  2007   [facility] (gastropathy, esophagitis - GERD vs. EE)  . NASAL SINUS SURGERY    . TONSILLECTOMY  1972  . VAGINAL HYSTERECTOMY  1995    Family Psychiatric History:  Father- schizophrenia, PTSD, brother- PTSD, brother's daughter- autistic, Paternal aunt- MS, paternal aunt's son- MG, sister's son- epilepsy, maternal uncle- epilepsy  Family History:  Family History  Problem Relation Age of Onset  . Arthritis Mother   . COPD Mother   . Cancer Father     leukemia  . Multiple sclerosis Paternal Aunt   . Heart disease Sister     PACs  . Diabetes Brother   .  Gout Brother   . Colon cancer Neg Hx     Social History:  Social History   Social History  . Marital status: Divorced    Spouse name: N/A  . Number of children: N/A  . Years of education: N/A   Social History Main Topics  . Smoking status: Never Smoker  . Smokeless tobacco: Never Used  . Alcohol use No     Comment:  05-24-2016 rarely  . Drug use: No     Comment: 05-24-2016 per pt no  . Sexual activity: Not Asked   Other Topics Concern  . 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 Was in [place] for 1.5 year and moved back to [place] while FMLA.  Allergies:  Allergies  Allergen Reactions  . Orlistat Other (See Comments)    Cognitive problems and neurological problems( memory, speaking, Heart PVC, coordination, vision, impaired discission making)    Metabolic Disorder Labs: No results found for: HGBA1C, MPG No results found for: PROLACTIN No results found for: CHOL, TRIG, HDL, CHOLHDL, VLDL, LDLCALC   Current Medications: Current Outpatient Prescriptions  Medication Sig Dispense Refill  . ARIPiprazole (ABILIFY) 5 MG tablet Take 1 tablet (5 mg total) by mouth daily. 90 tablet 0  . Cholecalciferol (VITAMIN D3) 2000 UNITS TABS Take by mouth daily.    Marland Kitchen estradiol (ESTRACE) 1 MG tablet Take 1 mg by mouth daily.    . Multiple Vitamin (MULTIVITAMIN) capsule Take 1 capsule by mouth daily.     No current facility-administered medications for this visit.     Neurologic: Headache: No Seizure: No Paresthesias: No  Musculoskeletal: Strength & Muscle Tone: within normal limits Gait & Station: normal Patient leans: N/A  Psychiatric Specialty Exam: Review of Systems  Psychiatric/Behavioral: Positive for memory loss. Negative for depression, hallucinations, substance abuse and suicidal ideas. The patient is nervous/anxious. The patient does not have insomnia.   All other systems reviewed and are negative.   Blood pressure 119/84, pulse 81, height 5\' 4"  (1.626 m), weight 223 lb (101.2 kg).Body mass index is 38.28 kg/m.  General Appearance: Fairly Groomed  Eye  Contact:  Good  Speech:  Clear and Coherent  Volume:  Normal  Mood:  "good"  Affect:  Constricted  Thought Process:  Descriptions of Associations: Circumstantial  Orientation:  Full (Time, Place, and Person)  Thought Content: talks about concern of her co-worker   Suicidal Thoughts:  No  Homicidal Thoughts:  No  Memory:  Immediate;   Good Recent;   Good Remote;   Good  Judgement:  Fair  Insight:  Shallow  Psychomotor Activity:  Normal  Concentration:  Concentration: Good and Attention Span: Good  Recall:  Good  Fund of Knowledge: Good  Language: Good  Akathisia:  No  Handed:  Right  AIMS (if indicated):    Assets:  Desire for Improvement Social Support  ADL's:  Intact  Cognition: WNL  Sleep:  good   Assessment KENEDIE DIROCCO is a 56 year old female with depression, dysphagia, who is referred by her PCP for schizophrenia.   # Unspecified schizophrenia spectrum and other psychotic disorder  # r/o schizophrenia Patient  continues to demonstrate circumstantial thought process, although there is slight improvement in her disorganization and she has less rumination on her paranoia since increasing Abilify. Chart review indicates patient has a long history of delusion, disorganized thought and behavior, and somatic hallucinations, which are likely consistent with schizophrenia. Patient is not amenable for higher dose of Abilify at this time; will continue the same dose. Given her reported history of deterioration under stress, will first make referral for Psychosocial Rehabilitation Program before approving her to return to work.   Plan 1. Continue Abilify 5 mg daily 2. Return to clinic in one month 3. Contact [facility] services for The Psychosocial Rehabilitation Program Phone: [phone] [place], [place] [address] Mailing Address: [place], [place], [place] [address] 4. Patient to bring the blood test  The patient demonstrates the following risk factors for  suicide: Chronic risk factors for suicide include: psychiatric disorder of r/o schizophrenia. Acute risk factors for suicide include: loss (financial, interpersonal, professional). Protective factors for this patient include: hope for the future. Considering these factors, the overall suicide risk at this point appears to be low. Patient is appropriate for outpatient follow up.   Treatment Plan Summary: Plan as above  The duration of this appointment visit was 30 minutes of face-to-face time with the patient.  Greater than 50% of this time was spent in counseling, explanation of  diagnosis, planning of further management, and coordination of care.  Norman Clay, MD 06/22/2016, 3:31 PM

## 2016-06-22 NOTE — Patient Instructions (Addendum)
1. Continue Abilify 5 mg daily 2. Return to clinic in one month 3. Contact [facility] for The Psychosocial Rehabilitation Program Phone: [phone] [place], [place] [address] Mailing Address: [place], [place], [place] [address] 4. Bring blood test result at the next visit

## 2016-06-23 ENCOUNTER — Telehealth (HOSPITAL_COMMUNITY): Payer: Self-pay | Admitting: *Deleted

## 2016-06-23 ENCOUNTER — Ambulatory Visit (HOSPITAL_COMMUNITY): Payer: Self-pay | Admitting: Psychiatry

## 2016-06-23 NOTE — Telephone Encounter (Signed)
voice message from patient, said she went to [facility] for rehab and they told her that they do not provide that service.

## 2016-06-23 NOTE — Telephone Encounter (Signed)
noted 

## 2016-06-23 NOTE — Telephone Encounter (Signed)
voice message from patient, said she went to [facility] for rehab and they told her that they do not provide that service

## 2016-06-23 NOTE — Telephone Encounter (Signed)
Advise the patient to contact this office for vocational rehabilitation.  Phone: [phone] Address: [place].

## 2016-06-30 ENCOUNTER — Telehealth (HOSPITAL_COMMUNITY): Payer: Self-pay | Admitting: *Deleted

## 2016-06-30 NOTE — Telephone Encounter (Signed)
phone call from patient.   said her deadline for her leave is 07/05/16.   She said they will not let her come back to work until she goes through a program.   She need Psychosocial rehabilitation right away.

## 2016-06-30 NOTE — Telephone Encounter (Signed)
Contacted the patient. Advised the patient to contact this office for vocational rehabilitation.  Phone: [phone] Address: [place].

## 2016-07-05 ENCOUNTER — Telehealth (HOSPITAL_COMMUNITY): Payer: Self-pay | Admitting: *Deleted

## 2016-07-05 ENCOUNTER — Telehealth (HOSPITAL_COMMUNITY): Payer: Self-pay | Admitting: Psychiatry

## 2016-07-05 NOTE — Telephone Encounter (Signed)
phone call from patient, she said the second phone number that was given to her for a referral to psycho social rehab, they said they do not provide that service.  She said they asked what she would like to accomplish, she said she don't know what she needs to accomplish.

## 2016-07-05 NOTE — Telephone Encounter (Signed)
Discussed with vocational rehab. They provide vocational rehab service before return to work, although the plan would differ depending on which therapist the patient works with. Communicated with patient about this, and she is encouraged to contact the program again.

## 2016-07-05 NOTE — Telephone Encounter (Signed)
noted 

## 2016-07-05 NOTE — Telephone Encounter (Signed)
Patient call back to the clinic, stating that although they do not offer the program, they advised to contact [facility] for [facility]. Patient is encouraged to contact that number. [place] [address] [place], [place] [address] [phone]

## 2016-07-07 ENCOUNTER — Telehealth (HOSPITAL_COMMUNITY): Payer: Self-pay | Admitting: *Deleted

## 2016-07-07 NOTE — Telephone Encounter (Signed)
Phone call from [place] from [facility] wanting to talk with Dr. Modesta Messing about pt. Her number is [phone]. Its about recommendation for PSR. Pt walked in and left envelop with new information for provider. Left inbox. Per pt she is on leave without pay and she need provider to fill out paperwork for insurance and she have not heard anything back. Per pt she is not worry about how she's going to pay her bills. [phone].

## 2016-07-07 NOTE — Telephone Encounter (Signed)
Discussed with Aniceto Boss about patient current condition with patient signed consent. Aniceto Boss states that she would reconsider intake for psychosocial rehabilitation.   Updated the above information to the patient.

## 2016-07-12 NOTE — Telephone Encounter (Signed)
noted 

## 2016-07-13 NOTE — Telephone Encounter (Signed)
Spoke to [employer] in Clinical biochemist.  Explained that we had already sent the information we have on this patient. She was only seen 1X on 01/06/16.

## 2016-07-18 NOTE — Progress Notes (Signed)
[facility] MD/PA/NP OP Progress Note  07/19/2016 2:12 PM Michele Lynch  MRN:  [id number]  Chief Complaint:  Chief Complaint    Paranoid; Follow-up     Subjective:  "I'm doing well" HPI:  Patient presents for follow up appointment. She states that she is very concerned as she lost her job. She reports that her position will be replaced, although she is allowed to take unpaid leave. She lost her enthusiasm, as it was her dream job. She feels good otherwise as she can be closer to her family and friends. She does not have any side effect from Abilify, and she denies increased appetite. She feels that she is almost back to normal in terms of her memory. She believes that her memory issue was secondary to orlistat. She enjoys reading, although she does not as much as she used to. She feels fatigue. She goes to [facility] regularly. She denies SI, AH/VH. She has started to go to [facility] for psychosocial rehabilitation every Monday for 15 weeks.  Wt Readings from Last 3 Encounters:  07/19/16 226 lb 6.4 oz (102.7 kg)  06/22/16 223 lb (101.2 kg)  05/24/16 219 lb 12.8 oz (99.7 kg)   Visit Diagnosis:    ICD-9-CM ICD-10-CM   1. Schizophrenia, unspecified type ([place]) 295.90 F20.9     Past Psychiatric History:  Outpatient: saw [facility] provider when she got divorced Psychiatry admission: denies Previous suicide attempt: denies Past trials of medication: Abilify for 3.5 months History of violence: denies  Past Medical History:  Past Medical History:  Diagnosis Date  . Allergy   . History of bladder infections   . Personal history of colonic polyps - pre-cancerous 01/01/2013  . Pre-diabetes   . PVC (premature ventricular contraction)     Past Surgical History:  Procedure Laterality Date  . [place]  . ESOPHAGOGASTRODUODENOSCOPY  2007   Gessner (gastropathy, esophagitis - GERD vs. EE)  . NASAL SINUS SURGERY    . TONSILLECTOMY  1972  . VAGINAL HYSTERECTOMY  1995    Family Psychiatric  History:  Father- schizophrenia, PTSD, brother- PTSD, brother's daughter- autistic, Paternal aunt- MS, paternal aunt's son- MG, sister's son- epilepsy, maternal uncle- epilepsy  Family History:  Family History  Problem Relation Age of Onset  . Arthritis Mother   . COPD Mother   . Cancer Father     leukemia  . Multiple sclerosis Paternal Aunt   . Heart disease Sister     PACs  . Diabetes Brother   . Gout Brother   . Colon cancer Neg Hx     Social History:  Social History   Social History  . Marital status: Divorced    Spouse name: N/A  . Number of children: N/A  . Years of education: N/A   Social History Main Topics  . Smoking status: Never Smoker  . Smokeless tobacco: Never Used  . Alcohol use No     Comment: 05-24-2016 rarely  . Drug use: No     Comment: 05-24-2016 per pt no  . Sexual activity: Not Asked   Other Topics Concern  . 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 Was in [place] for 1.5 year and moved back to [place] while FMLA.  Allergies:  Allergies  Allergen Reactions  . Orlistat Other (See Comments)    Cognitive problems and neurological problems( memory, speaking, Heart PVC, coordination, vision, impaired discission making)    Metabolic Disorder Labs: No results found for: HGBA1C, MPG No results found for: PROLACTIN No results found for: CHOL, TRIG, HDL, CHOLHDL, VLDL, LDLCALC   Current Medications: Current Outpatient Prescriptions  Medication Sig Dispense Refill  . ARIPiprazole (ABILIFY) 5 MG tablet Take 1 tablet (5 mg total) by mouth daily. 90 tablet 0  . Cholecalciferol (VITAMIN D3) 2000 UNITS TABS Take by mouth daily.    Marland Kitchen estradiol (ESTRACE) 1 MG tablet Take 1 mg by mouth daily.    . Multiple Vitamin  (MULTIVITAMIN) capsule Take 1 capsule by mouth daily.     No current facility-administered medications for this visit.     Neurologic: Headache: No Seizure: No Paresthesias: No  Musculoskeletal: Strength & Muscle Tone: within normal limits Gait & Station: normal Patient leans: N/A  Psychiatric Specialty Exam: Review of Systems  Psychiatric/Behavioral: Positive for memory loss. Negative for depression, hallucinations, substance abuse and suicidal ideas. The patient is nervous/anxious. The patient does not have insomnia.   All other systems reviewed and are negative.   Blood pressure 140/80, pulse 93, height 5\' 4"  (1.626 m), weight 226 lb 6.4 oz (102.7 kg).Body mass index is 38.86 kg/m.  General Appearance: Well Groomed  Eye Contact:  Good  Speech:  Clear and Coherent  Volume:  Normal  Mood:  "good"  Affect:  Appropriate and Congruent  Thought Process:  Coherent and Goal Directed  Orientation:  Full (Time, Place, and Person)  Thought Content: no paranoia   Suicidal Thoughts:  No  Homicidal Thoughts:  No  Memory:  Immediate;   Good Recent;   Good Remote;   Good  Judgement:  Fair  Insight:  Fair  Psychomotor Activity:  Normal  Concentration:  Concentration: Good and Attention Span: Good  Recall:  Good  Fund of Knowledge: Good  Language: Good  Akathisia:  No  Handed:  Right  AIMS (if indicated):    Assets:  Desire for Improvement Social Support  ADL's:  Intact  Cognition: WNL  Sleep:  good   Assessment Michele Lynch is a 56 year old female with depression, dysphagia, who presents for follow up appointment for paranoia.  # Unspecified schizophrenia spectrum and other psychotic disorder  # r/o schizophrenia There has been significant improvement in her disorganization and paranoia since uptitration of Abilify. Although she has had a good response despite low dose of Abilify, chart review indicates patient has a long history of delusion, disorganized thought and  behavior, and somatic hallucinations, which are likely consistent with schizophrenia. Will continue current dose. Will monitor for weight gain. She has been referred to [facility] for psychosocial rehabilitation. She agrees to hold returning to work while engaging in this program.   Plan 1. Continue Abilify 5 mg daily 2. Return to clinic in one month  The patient demonstrates the following risk factors for suicide: Chronic risk factors for suicide include: psychiatric disorder of r/o schizophrenia. Acute risk factors for suicide include: loss (financial, interpersonal, professional). Protective factors for this patient include: hope for the future. Considering these factors, the overall suicide risk at this point appears to be low. Patient is appropriate for outpatient follow up.   Treatment Plan Summary: Plan as above  Norman Clay, MD 07/19/2016,  2:12 PM

## 2016-07-19 ENCOUNTER — Encounter (HOSPITAL_COMMUNITY): Payer: Self-pay | Admitting: Psychiatry

## 2016-07-19 ENCOUNTER — Telehealth (HOSPITAL_COMMUNITY): Payer: Self-pay | Admitting: *Deleted

## 2016-07-19 ENCOUNTER — Ambulatory Visit (INDEPENDENT_AMBULATORY_CARE_PROVIDER_SITE_OTHER): Payer: 59 | Admitting: Psychiatry

## 2016-07-19 VITALS — BP 140/80 | HR 93 | Ht 64.0 in | Wt 226.4 lb

## 2016-07-19 DIAGNOSIS — F209 Schizophrenia, unspecified: Secondary | ICD-10-CM

## 2016-07-19 DIAGNOSIS — Z818 Family history of other mental and behavioral disorders: Secondary | ICD-10-CM

## 2016-07-19 DIAGNOSIS — Z79899 Other long term (current) drug therapy: Secondary | ICD-10-CM | POA: Diagnosis not present

## 2016-07-19 DIAGNOSIS — R1310 Dysphagia, unspecified: Secondary | ICD-10-CM | POA: Diagnosis not present

## 2016-07-19 DIAGNOSIS — Z888 Allergy status to other drugs, medicaments and biological substances status: Secondary | ICD-10-CM

## 2016-07-19 NOTE — Patient Instructions (Signed)
1. Continue Abilify 5 mg daily 2. Return to clinic in one month

## 2016-07-19 NOTE — Telephone Encounter (Signed)
Pt came into office for f/u with provider. Per pt, she had provider to complete a form for her to provide it to her workers [employer]. Per pt she wanted to have that and she will mail it to them. RMA made a copy of form which will be sent to scan center and handed original copy of filled out form to pt.

## 2016-08-15 NOTE — Progress Notes (Deleted)
[place] MD/PA/NP OP Progress Note  08/15/2016 10:16 AM Michele Lynch  MRN:  [id number]  Chief Complaint:   Subjective:  "I'm doing well" HPI:  Patient presents for follow up appointment. She states that she is very concerned as she lost her job. She reports that her position will be replaced, although she is allowed to take unpaid leave. She lost her enthusiasm, as it was her dream job. She feels good otherwise as she can be closer to her family and friends. She does not have any side effect from Abilify, and she denies increased appetite. She feels that she is almost back to normal in terms of her memory. She believes that her memory issue was secondary to orlistat. She enjoys reading, although she does not as much as she used to. She feels fatigue. She goes to [facility] regularly. She denies SI, AH/VH. She has started to go to [facility] for psychosocial rehabilitation every Monday for 15 weeks.  Wt Readings from Last 3 Encounters:  07/19/16 226 lb 6.4 oz (102.7 kg)  06/22/16 223 lb (101.2 kg)  05/24/16 219 lb 12.8 oz (99.7 kg)   Visit Diagnosis:  No diagnosis found.  Past Psychiatric History:  Outpatient: saw [place] provider when she got divorced Psychiatry admission: denies Previous suicide attempt: denies Past trials of medication: Abilify for 3.5 months History of violence: denies  Past Medical History:  Past Medical History:  Diagnosis Date  . Allergy   . History of bladder infections   . Personal history of colonic polyps - pre-cancerous 01/01/2013  . Pre-diabetes   . PVC (premature ventricular contraction)     Past Surgical History:  Procedure Laterality Date  . [place]  . ESOPHAGOGASTRODUODENOSCOPY  2007   Gessner (gastropathy, esophagitis - GERD vs. EE)  . NASAL SINUS SURGERY    . TONSILLECTOMY  1972  . VAGINAL HYSTERECTOMY  1995    Family Psychiatric History:  Father- schizophrenia, PTSD, brother- PTSD, brother's daughter- autistic, Paternal aunt- MS,  paternal aunt's son- MG, sister's son- epilepsy, maternal uncle- epilepsy  Family History:  Family History  Problem Relation Age of Onset  . Arthritis Mother   . COPD Mother   . Cancer Father     leukemia  . Multiple sclerosis Paternal Aunt   . Heart disease Sister     PACs  . Diabetes Brother   . Gout Brother   . Colon cancer Neg Hx     Social History:  Social History   Social History  . Marital status: Divorced    Spouse name: N/A  . Number of children: N/A  . Years of education: N/A   Social History Main Topics  . Smoking status: Never Smoker  . Smokeless tobacco: Never Used  . Alcohol use No     Comment: 05-24-2016 rarely  . Drug use: No     Comment: 05-24-2016 per pt no  . Sexual activity: Not on file   Other Topics Concern  . 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 Was in [place] for 1.5 year and moved back to [place] while FMLA.  Allergies:  Allergies  Allergen Reactions  . Orlistat Other (See Comments)    Cognitive problems and neurological problems( memory, speaking, Heart PVC, coordination, vision, impaired discission making)    Metabolic Disorder Labs: No results found for: HGBA1C, MPG No results found for: PROLACTIN No results found for: CHOL, TRIG, HDL, CHOLHDL, VLDL, LDLCALC   Current Medications: Current Outpatient Prescriptions  Medication Sig Dispense Refill  . ARIPiprazole (ABILIFY) 5 MG tablet Take 1 tablet (5 mg total) by mouth daily. 90 tablet 0  . Cholecalciferol (VITAMIN D3) 2000 UNITS TABS Take by mouth daily.    Marland Kitchen estradiol (ESTRACE) 1 MG tablet Take 1 mg by mouth daily.    . Multiple Vitamin (MULTIVITAMIN) capsule Take 1 capsule by mouth daily.     No current facility-administered  medications for this visit.     Neurologic: Headache: No Seizure: No Paresthesias: No  Musculoskeletal: Strength & Muscle Tone: within normal limits Gait & Station: normal Patient leans: N/A  Psychiatric Specialty Exam: Review of Systems  Psychiatric/Behavioral: Positive for memory loss. Negative for depression, hallucinations, substance abuse and suicidal ideas. The patient is nervous/anxious. The patient does not have insomnia.   All other systems reviewed and are negative.   There were no vitals taken for this visit.There is no height or weight on file to calculate BMI.  General Appearance: Well Groomed  Eye Contact:  Good  Speech:  Clear and Coherent  Volume:  Normal  Mood:  "good"  Affect:  Appropriate and Congruent  Thought Process:  Coherent and Goal Directed  Orientation:  Full (Time, Place, and Person)  Thought Content: no paranoia   Suicidal Thoughts:  No  Homicidal Thoughts:  No  Memory:  Immediate;   Good Recent;   Good Remote;   Good  Judgement:  Fair  Insight:  Fair  Psychomotor Activity:  Normal  Concentration:  Concentration: Good and Attention Span: Good  Recall:  Good  Fund of Knowledge: Good  Language: Good  Akathisia:  No  Handed:  Right  AIMS (if indicated):    Assets:  Desire for Improvement Social Support  ADL's:  Intact  Cognition: WNL  Sleep:  good   Assessment Michele Lynch is a 56 year old female with depression, dysphagia, who presents for follow up appointment for paranoia.  # Unspecified schizophrenia spectrum and other psychotic disorder  # r/o schizophrenia There has been significant improvement in her disorganization and paranoia since uptitration of Abilify. Although she has had a good response despite low dose of Abilify, chart review indicates patient has a long history of delusion, disorganized thought and behavior, and somatic hallucinations, which are likely consistent with schizophrenia. Will continue current dose. Will  monitor for weight gain. She has been referred to [facility] for psychosocial rehabilitation. She agrees to hold returning to work while engaging in this program.   Plan 1. Continue Abilify 5 mg daily 2. Return to clinic in one month  The patient demonstrates the following risk factors for suicide: Chronic risk factors for suicide include: psychiatric disorder of r/o schizophrenia. Acute risk factors for suicide include: loss (financial, interpersonal, professional). Protective factors for this patient include: hope for the future. Considering these factors, the overall suicide risk at this point appears to be low. Patient is appropriate for outpatient follow up.   Treatment Plan Summary: Plan as above  Norman Clay, MD 08/15/2016, 10:16 AM

## 2016-08-16 ENCOUNTER — Ambulatory Visit (HOSPITAL_COMMUNITY): Payer: Self-pay | Admitting: Psychiatry

## 2016-08-17 ENCOUNTER — Encounter (HOSPITAL_COMMUNITY): Payer: Self-pay | Admitting: Psychiatry

## 2016-08-17 ENCOUNTER — Ambulatory Visit (INDEPENDENT_AMBULATORY_CARE_PROVIDER_SITE_OTHER): Payer: 59 | Admitting: Psychiatry

## 2016-08-17 VITALS — BP 123/87 | HR 84 | Ht 64.0 in | Wt 226.0 lb

## 2016-08-17 DIAGNOSIS — Z79899 Other long term (current) drug therapy: Secondary | ICD-10-CM

## 2016-08-17 DIAGNOSIS — Z818 Family history of other mental and behavioral disorders: Secondary | ICD-10-CM | POA: Diagnosis not present

## 2016-08-17 DIAGNOSIS — F209 Schizophrenia, unspecified: Secondary | ICD-10-CM

## 2016-08-17 MED ORDER — ARIPIPRAZOLE 5 MG PO TABS: 5.0000 mg | ORAL_TABLET | Freq: Every day | ORAL | 0 refills | Status: DC

## 2016-08-17 NOTE — Patient Instructions (Signed)
1. Continue Abilify 5 mg daily 2. Return to clinic in one month

## 2016-08-17 NOTE — Progress Notes (Signed)
[place] MD/PA/NP OP Progress Note  08/17/2016 3:55 PM Michele Lynch  MRN:  [id number]  Chief Complaint:  Chief Complaint    Paranoid; Follow-up     Subjective:  "I'm doing well" HPI:  Patient presents for follow up appointment. She states that she is doing well since the last appointment. She asked several questions; whether her episode of back pain to brain "magnetic feeling," her cognitive impairment and memory loss are related to schizophrenia. She also talks about an episode when she felt she had a "seizure" such as word finding difficulty and gait impairment last year. She denies any of those symptoms since the last appointment. She goes to [facility] and attend a class to work on communication with others. She reports that she did not meet criteria to attend psychosocial rehabilitation according to the evaluation over there. She feels that her cognition is not back to the original level and she might have difficulty in curricula, although it has been significantly improved since last year. She wonders whether she should take some class for it. She denies depression. She denies insomnia. She reports good energy and has gone back to [facility]. She denies SI. She denies ideas of reference. She denies AH/VH.   Wt Readings from Last 3 Encounters:  08/17/16 226 lb (102.5 kg)  07/19/16 226 lb 6.4 oz (102.7 kg)  06/22/16 223 lb (101.2 kg)   Visit Diagnosis:    ICD-9-CM ICD-10-CM   1. Schizophrenia, unspecified type ([place]) 295.90 F20.9     Past Psychiatric History:  Outpatient: saw [place] provider when she got divorced Psychiatry admission: denies Previous suicide attempt: denies Past trials of medication: Abilify for 3.5 months History of violence: denies  Past Medical History:  Past Medical History:  Diagnosis Date  . Allergy   . History of bladder infections   . Personal history of colonic polyps - pre-cancerous 01/01/2013  . Pre-diabetes   . PVC (premature ventricular contraction)     Past  Surgical History:  Procedure Laterality Date  . [place]  . ESOPHAGOGASTRODUODENOSCOPY  2007   Gessner (gastropathy, esophagitis - GERD vs. EE)  . NASAL SINUS SURGERY    . TONSILLECTOMY  1972  . VAGINAL HYSTERECTOMY  1995    Family Psychiatric History:  Father- schizophrenia, PTSD, brother- PTSD, brother's daughter- autistic, Paternal aunt- MS, paternal aunt's son- MG, sister's son- epilepsy, maternal uncle- epilepsy  Family History:  Family History  Problem Relation Age of Onset  . Arthritis Mother   . COPD Mother   . Cancer Father     leukemia  . Multiple sclerosis Paternal Aunt   . Heart disease Sister     PACs  . Diabetes Brother   . Gout Brother   . Colon cancer Neg Hx     Social History:  Social History   Social History  . Marital status: Divorced    Spouse name: N/A  . Number of children: N/A  . Years of education: N/A   Social History Main Topics  . Smoking status: Never Smoker  . Smokeless tobacco: Never Used  . Alcohol use No     Comment: 05-24-2016 rarely  . Drug use: No     Comment: 05-24-2016 per pt no  . Sexual activity: Not Asked   Other Topics Concern  . 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  Allergies:  Allergies  Allergen Reactions  . Orlistat Other (See Comments)    Cognitive problems and neurological problems( memory, speaking, Heart PVC, coordination, vision, impaired discission making)    Metabolic Disorder Labs: No results found for: HGBA1C, MPG No results found for: PROLACTIN No results found for: CHOL, TRIG, HDL, CHOLHDL, VLDL, LDLCALC   Current Medications: Current Outpatient Prescriptions  Medication  Sig Dispense Refill  . ARIPiprazole (ABILIFY) 5 MG tablet Take 1 tablet (5 mg total) by mouth daily. 90 tablet 0  . Cholecalciferol (VITAMIN D3) 2000 UNITS TABS Take by mouth daily.    Marland Kitchen estradiol (ESTRACE) 1 MG tablet Take 1 mg by mouth daily.    . Multiple Vitamin (MULTIVITAMIN) capsule Take 1 capsule by mouth daily.     No current facility-administered medications for this visit.     Neurologic: Headache: No Seizure: No Paresthesias: No  Musculoskeletal: Strength & Muscle Tone: within normal limits Gait & Station: normal Patient leans: N/A  Psychiatric Specialty Exam: Review of Systems  Psychiatric/Behavioral: Positive for memory loss. Negative for depression, hallucinations, substance abuse and suicidal ideas. The patient is not nervous/anxious and does not have insomnia.   All other systems reviewed and are negative.   Blood pressure 123/87, pulse 84, height 5\' 4"  (1.626 m), weight 226 lb (102.5 kg).Body mass index is 38.79 kg/m.  General Appearance: Well Groomed  Eye Contact:  Good  Speech:  Clear and Coherent  Volume:  Normal  Mood:  "good"  Affect:  Appropriate and Congruent  Thought Process:  Coherent and Goal Directed  Orientation:  Full (Time, Place, and Person)  Thought Content: no paranoia   Suicidal Thoughts:  No  Homicidal Thoughts:  No  Memory:  Immediate;   Good Recent;   Good Remote;   Good  Judgement:  Good  Insight:  Good  Psychomotor Activity:  Normal  Concentration:  Concentration: Good and Attention Span: Good  Recall:  Good  Fund of Knowledge: Good  Language: Good  Akathisia:  No  Handed:  Right  AIMS (if indicated):    Assets:  Desire for Improvement Social Support  ADL's:  Intact  Cognition: WNL  Sleep:  good   Assessment [name] is a 56 year old female with depression, dysphagia, who presents for follow up appointment for paranoia.  # Unspecified schizophrenia spectrum and other psychotic disorder  # r/o  schizophrenia Exam is notable for her improved insight and she demonstrated organized thought process without paranoia, which has been significantly improved since uptitration of Abilify. Although the dose is lower than expected one for people with psychotic disorder, chart review indicates patient has a long history of delusion, disorganized thought/behavior, and somatic hallucinations, which are likely consistent with schizophrenia. Will continue current dose. She is referred to [facility] for psychosocial rehabilitation (she is taking the class there). Discussed the rationale/planning of returning to work.   # r/o neurocognitive impairment Patient was diagnosed with unspecified neurocognitive disorder based on neuropsychological fitness for duty evaluation by D. Yvetta Coder, Ph.D. 02/17/2016. Although she reports some improvement in her cognition, she still complains of some impairment on today's evaluation. It is unclear whether this is secondary to her psychiatric disturbance; will continue to monitor and  make a referral to another neuropsychological testing as needed.   Plan 1. Continue Abilify 5 mg daily 2. Return to clinic in one month  The patient demonstrates the following risk factors for suicide: Chronic risk factors for suicide include: psychiatric disorder of r/o schizophrenia. Acute risk factors for suicide include: loss (financial, interpersonal, professional). Protective factors for this patient include: hope for the future. Considering these factors, the overall suicide risk at this point appears to be low. Patient is appropriate for outpatient follow up.   Treatment Plan Summary: Plan as above  The duration of this appointment visit was 30 minutes of face-to-face time with the patient.  Greater than 50% of this time was spent in counseling, explanation of  diagnosis, planning of further management, and coordination of care.  Norman Clay, MD 08/17/2016, 3:55 PM

## 2016-09-06 NOTE — Progress Notes (Signed)
[place] MD/PA/NP OP Progress Note  09/13/2016 2:31 PM Michele Lynch  MRN:  [id number]  Chief Complaint:  Chief Complaint    Follow-up; Other     Subjective:  "I'm not focusing well." HPI:  Patient presents for follow up appointment. She states that although she feels well, she wonders if taking Abilify twice a day might work better for her. She states that she tends to feel tired at the end of the day and has less motivation to do things. She also feels "staticky" eyes and has difficulty with some vision. 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 Although she enrolled in calculation class, she canceled it as she was concerned about her low motivation and did not feel she would be able to make it. She visits her mother every day and started to date with her ex-husband. 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 She complains of thinner hair although she also notices that she has reborn new hair. She denies paranoia. She feels slightly dizzy at times. She denies insomnia. She denies SI, AH/VH. She feels forgetful.   TSH 2.620 5/4   Wt Readings from Last 3 Encounters:  09/13/16 228 lb (103.4 kg)  08/17/16 226 lb (102.5 kg)  07/19/16 226 lb 6.4 oz (102.7 kg)   Visit Diagnosis:    ICD-9-CM ICD-10-CM   1. Schizophrenia, unspecified type ([place]) 295.90 F20.9     Past Psychiatric History:  Outpatient: saw [facility] provider when she got divorced Psychiatry admission: denies Previous suicide attempt: denies Past trials of medication: Abilify for 3.5 months History of violence: denies  Past Medical History:  Past Medical History:  Diagnosis Date  . Allergy   . History of bladder infections   . Personal history of colonic polyps - pre-cancerous 01/01/2013  . Pre-diabetes   . PVC (premature ventricular contraction)     Past Surgical History:  Procedure Laterality Date  . [place]  . ESOPHAGOGASTRODUODENOSCOPY  2007   Gessner (gastropathy, esophagitis - GERD vs. EE)  . NASAL SINUS SURGERY    . TONSILLECTOMY  1972  . VAGINAL HYSTERECTOMY  1995    Family Psychiatric History:  Father- schizophrenia, PTSD, brother- PTSD, brother's daughter- autistic, Paternal aunt- MS, paternal aunt's son- MG, sister's son- epilepsy, maternal uncle- epilepsy  Family History:  Family History  Problem Relation Age of Onset  . Arthritis Mother   . COPD Mother   . Cancer Father        leukemia  . Multiple sclerosis Paternal Aunt   . Heart disease Sister        PACs  . Diabetes Brother   . Gout Brother   . Colon cancer Neg Hx     Social History:  Social History   Social History  . Marital status: Divorced    Spouse name: N/A  . Number of children: N/A  . Years of education: N/A   Social History Main Topics  . Smoking status: Never Smoker  . Smokeless tobacco: Never Used  . Alcohol use No     Comment: 05-24-2016 rarely  . Drug use: No     Comment: 05-24-2016 per pt no  . Sexual activity: Not on file   Other Topics Concern  . 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 Was in [place] for 1.5 year and moved back to [place] while FMLA.  Allergies:  Allergies  Allergen Reactions  . Orlistat Other (See Comments)    Cognitive problems and neurological problems( memory, speaking, Heart PVC, coordination, vision, impaired discission making)    Metabolic Disorder Labs: No results found for: HGBA1C, MPG No results found for: PROLACTIN No results found for: CHOL, TRIG, HDL, CHOLHDL, VLDL, LDLCALC   Current Medications: Current Outpatient Prescriptions  Medication Sig Dispense Refill  . ARIPiprazole (ABILIFY) 5 MG tablet 5 mg in the morning and 2.5 mg in the  afternoon 135 tablet 0  . Cholecalciferol (VITAMIN D3) 2000 UNITS TABS Take by mouth daily.    Marland Kitchen estradiol (ESTRACE) 1 MG tablet Take 1 mg by mouth daily.    . Multiple Vitamin (MULTIVITAMIN) capsule Take 1 capsule by mouth daily.     No current facility-administered medications for this visit.     Neurologic: Headache: No Seizure: No Paresthesias: No  Musculoskeletal: Strength & Muscle Tone: within normal limits Gait & Station: normal Patient leans: N/A  Psychiatric Specialty Exam: Review of Systems  Psychiatric/Behavioral: Positive for memory loss. Negative for depression, hallucinations, substance abuse and suicidal ideas. The patient is not nervous/anxious and does not have insomnia.   All other systems reviewed and are negative.   Blood pressure 129/77, pulse 85, height 5' 4.02" (1.626 m), weight 228 lb (103.4 kg).Body mass index is 39.12 kg/m.  General Appearance: Well Groomed  Eye Contact:  Good  Speech:  Clear and Coherent  Volume:  Normal  Mood:  "good"  Affect:  Appropriate and Congruent (slightly restricted)  Thought Process:  Coherent and Goal Directed  Orientation:  Full (Time, Place, and Person)  Thought Content: no paranoia   Suicidal Thoughts:  No  Homicidal Thoughts:  No  Memory:  Immediate;   Good Recent;   Good Remote;   Good  Judgement:  Good  Insight:  Good  Psychomotor Activity:  Normal  Concentration:  Concentration: Good and Attention Span: Good  Recall:  Good  Fund of Knowledge: Good  Language: Good  Akathisia:  No  Handed:  Right  AIMS (if indicated):  No tremors, no rigidity  Assets:  Desire for Improvement Social Support  ADL's:  Intact  Cognition: WNL  Sleep:  good   Assessment Michele Lynch is a 56 year old female with depression, dysphagia, who presents for follow up appointment for paranoia.  # Unspecified schizophrenia spectrum and other psychotic disorder  # r/o schizophrenia Today's exam is notable for her somewhat  perseveration on somatic symptoms. She does have tendency to withdraw in the afternoon, which can be a manifestation of negative symptoms. She does have a long history of delusion, disorganized thought/behavior, somatic hallucinations per chart review, which are likely consistent with schizophrenia. Will uptitrate Abilify to target her somatic symptoms. Will monitor weight gain. Noted that she was referred to [facility] for psychosocial rehabilitation and she joins the class.   # r/o neurocognitive impairment Patient was diagnosed with unspecified neurocognitive disorder based on neuropsychological fitness for duty evaluation by D. Yvetta Coder, Ph.D. 02/17/2016. Although she reports some improvement in her cognition, she continues to endorse memory loss. Although it is likely secondary to her thought disorder, will continue to monitor  and make a referral to another neuropsychological testing as needed.   Plan 1. Increase Abilify 5 mg daily and 2.5 mg in the afternoon 2. Return to clinic in one month for 30 mins  The patient demonstrates the following risk factors for suicide: Chronic risk factors for suicide include: psychiatric disorder of r/o schizophrenia. Acute risk factors for suicide include: loss (financial, interpersonal, professional). Protective factors for this patient include: hope for the future. Considering these factors, the overall suicide risk at this point appears to be low. Patient is appropriate for outpatient follow up.   Treatment Plan Summary: Plan as above  The duration of this appointment visit was 30 minutes of face-to-face time with the patient.  Greater than 50% of this time was spent in counseling, explanation of  diagnosis, planning of further management, and coordination of care.  Norman Clay, MD 09/13/2016, 2:31 PM

## 2016-09-13 ENCOUNTER — Ambulatory Visit (INDEPENDENT_AMBULATORY_CARE_PROVIDER_SITE_OTHER): Payer: 59 | Admitting: Psychiatry

## 2016-09-13 VITALS — BP 129/77 | HR 85 | Ht 64.02 in | Wt 228.0 lb

## 2016-09-13 DIAGNOSIS — Z818 Family history of other mental and behavioral disorders: Secondary | ICD-10-CM

## 2016-09-13 DIAGNOSIS — R1310 Dysphagia, unspecified: Secondary | ICD-10-CM

## 2016-09-13 DIAGNOSIS — F209 Schizophrenia, unspecified: Secondary | ICD-10-CM

## 2016-09-13 DIAGNOSIS — Z810 Family history of intellectual disabilities: Secondary | ICD-10-CM

## 2016-09-13 MED ORDER — ARIPIPRAZOLE 5 MG PO TABS: ORAL_TABLET | ORAL | 0 refills | Status: DC

## 2016-09-13 NOTE — Patient Instructions (Signed)
1. Increase Abilify 5 mg daily and 2.5 mg in the afternoon 2. Return to clinic in one month for 30 mins

## 2016-10-11 NOTE — Progress Notes (Deleted)
[place] MD/PA/NP OP Progress Note  10/11/2016 10:58 AM Michele Lynch  MRN:  [id number]  Chief Complaint:   Subjective:  "I'm not focusing well." HPI:  Patient presents for follow up appointment. She states that although she feels well, she wonders if taking Abilify twice a day might work better for her. She states that she tends to feel tired at the end of the day and has less motivation to do things. She also feels "staticky" eyes and has difficulty with some vision. 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 Although she enrolled in calculation class, she canceled it as she was concerned about her low motivation and did not feel she would be able to make it. She visits her mother every day and started to date with her ex-husband. 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 She complains of thinner hair although she also notices that she has reborn new hair. She denies paranoia. She feels slightly dizzy at times. She denies insomnia. She denies SI, AH/VH. She feels forgetful.   TSH 2.620 5/4   Wt Readings from Last 3 Encounters:  09/13/16 228 lb (103.4 kg)  08/17/16 226 lb (102.5 kg)  07/19/16 226 lb 6.4 oz (102.7 kg)   Visit Diagnosis:  No diagnosis found.  Past Psychiatric History:  Outpatient: saw [facility] provider when she got divorced Psychiatry admission: denies Previous suicide attempt: denies Past trials of medication: Abilify for 3.5 months History of violence: denies  Past Medical History:  Past Medical History:  Diagnosis Date  . Allergy   . History of bladder infections   . Personal history of colonic polyps - pre-cancerous 01/01/2013  . Pre-diabetes   . PVC (premature ventricular contraction)     Past Surgical History:  Procedure Laterality Date  . [place]  . ESOPHAGOGASTRODUODENOSCOPY  2007   Gessner (gastropathy, esophagitis - GERD vs. EE)  . NASAL SINUS SURGERY    . TONSILLECTOMY  1972  .  VAGINAL HYSTERECTOMY  1995    Family Psychiatric History:  Father- schizophrenia, PTSD, brother- PTSD, brother's daughter- autistic, Paternal aunt- MS, paternal aunt's son- MG, sister's son- epilepsy, maternal uncle- epilepsy  Family History:  Family History  Problem Relation Age of Onset  . Arthritis Mother   . COPD Mother   . Cancer Father        leukemia  . Multiple sclerosis Paternal Aunt   . Heart disease Sister        PACs  . Diabetes Brother   . Gout Brother   . Colon cancer Neg Hx     Social History:  Social History   Social History  . Marital status: Divorced    Spouse name: N/A  . Number of children: N/A  . Years of education: N/A   Social History Main Topics  . Smoking status: Never Smoker  . Smokeless tobacco: Never Used  . Alcohol use No     Comment: 05-24-2016 rarely  . Drug use: No     Comment: 05-24-2016 per pt no  . Sexual activity: Not on file   Other Topics Concern  . 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 Was in [place] for 1.5 year and moved back to  while FMLA.  Allergies:  Allergies  Allergen Reactions  . Orlistat Other (See Comments)    Cognitive problems and neurological problems( memory, speaking, Heart PVC, coordination, vision, impaired discission making)    Metabolic Disorder Labs: No results found for: HGBA1C, MPG No results found for: PROLACTIN No results found for: CHOL, TRIG, HDL, CHOLHDL, VLDL, LDLCALC   Current Medications: Current Outpatient Prescriptions  Medication Sig Dispense Refill  . ARIPiprazole (ABILIFY) 5 MG tablet 5 mg in the morning and 2.5 mg in the afternoon 135 tablet 0  . Cholecalciferol (VITAMIN D3) 2000 UNITS TABS Take by mouth daily.    Marland Kitchen estradiol  (ESTRACE) 1 MG tablet Take 1 mg by mouth daily.    . Multiple Vitamin (MULTIVITAMIN) capsule Take 1 capsule by mouth daily.     No current facility-administered medications for this visit.     Neurologic: Headache: No Seizure: No Paresthesias: No  Musculoskeletal: Strength & Muscle Tone: within normal limits Gait & Station: normal Patient leans: N/A  Psychiatric Specialty Exam: Review of Systems  Psychiatric/Behavioral: Positive for memory loss. Negative for depression, hallucinations, substance abuse and suicidal ideas. The patient is not nervous/anxious and does not have insomnia.   All other systems reviewed and are negative.   There were no vitals taken for this visit.There is no height or weight on file to calculate BMI.  General Appearance: Well Groomed  Eye Contact:  Good  Speech:  Clear and Coherent  Volume:  Normal  Mood:  "good"  Affect:  Appropriate and Congruent (slightly restricted)  Thought Process:  Coherent and Goal Directed  Orientation:  Full (Time, Place, and Person)  Thought Content: no paranoia   Suicidal Thoughts:  No  Homicidal Thoughts:  No  Memory:  Immediate;   Good Recent;   Good Remote;   Good  Judgement:  Good  Insight:  Good  Psychomotor Activity:  Normal  Concentration:  Concentration: Good and Attention Span: Good  Recall:  Good  Fund of Knowledge: Good  Language: Good  Akathisia:  No  Handed:  Right  AIMS (if indicated):  No tremors, no rigidity  Assets:  Desire for Improvement Social Support  ADL's:  Intact  Cognition: WNL  Sleep:  good   Assessment Michele Lynch is a 56 year old female with depression, dysphagia, who presents for follow up appointment for paranoia.  # Unspecified schizophrenia spectrum and other psychotic disorder  # r/o schizophrenia Today's exam is notable for her somewhat perseveration on somatic symptoms. She does have tendency to withdraw in the afternoon, which can be a manifestation of negative  symptoms. She does have a long history of delusion, disorganized thought/behavior, somatic hallucinations per chart review, which are likely consistent with schizophrenia. Will uptitrate Abilify to target her somatic symptoms. Will monitor weight gain. Noted that she was referred to [facility] for psychosocial rehabilitation and she joins the class.   # r/o neurocognitive impairment Patient was diagnosed with unspecified neurocognitive disorder based on neuropsychological fitness for duty evaluation by D. Yvetta Coder, Ph.D. 02/17/2016. Although she reports some improvement in her cognition, she continues to endorse memory loss. Although it is likely secondary to her thought disorder, will continue to monitor and make a referral to another neuropsychological testing as needed.   Plan 1. Increase Abilify 5 mg daily and 2.5 mg in the afternoon 2.  Return to clinic in one month for 30 mins  The patient demonstrates the following risk factors for suicide: Chronic risk factors for suicide include: psychiatric disorder of r/o schizophrenia. Acute risk factors for suicide include: loss (financial, interpersonal, professional). Protective factors for this patient include: hope for the future. Considering these factors, the overall suicide risk at this point appears to be low. Patient is appropriate for outpatient follow up.   Treatment Plan Summary: Plan as above  The duration of this appointment visit was 30 minutes of face-to-face time with the patient.  Greater than 50% of this time was spent in counseling, explanation of  diagnosis, planning of further management, and coordination of care.  Norman Clay, MD 10/11/2016, 10:58 AM

## 2016-10-14 ENCOUNTER — Encounter (HOSPITAL_COMMUNITY): Payer: Self-pay | Admitting: Psychiatry

## 2016-10-14 ENCOUNTER — Ambulatory Visit (INDEPENDENT_AMBULATORY_CARE_PROVIDER_SITE_OTHER): Payer: 59 | Admitting: Psychiatry

## 2016-10-14 VITALS — BP 126/82 | HR 80 | Wt 226.6 lb

## 2016-10-14 DIAGNOSIS — R413 Other amnesia: Secondary | ICD-10-CM

## 2016-10-14 DIAGNOSIS — F1010 Alcohol abuse, uncomplicated: Secondary | ICD-10-CM

## 2016-10-14 DIAGNOSIS — F4320 Adjustment disorder, unspecified: Secondary | ICD-10-CM | POA: Diagnosis not present

## 2016-10-14 MED ORDER — ARIPIPRAZOLE 5 MG PO TABS: 5.0000 mg | ORAL_TABLET | Freq: Every day | ORAL | 0 refills | Status: DC

## 2016-10-14 NOTE — Progress Notes (Signed)
[facility] MD/PA/NP OP Progress Note  10/14/2016 10:46 AM Michele Lynch  MRN:  [id number]  Chief Complaint:  Chief Complaint    Follow-up; Medication Refill     Subjective:  "I'm doing very well" HPI:  Patient presents for follow-up appointment. She states that she could not increase Abilify as the pharmacy could not dispense it. However, she is doing very well. 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 She enjoys this job and denies any concern. She completed the program at [facility]. She finds this program to be helpful to learn coping skills. She states that her work position was taken by other people and she does not think she can get back to the original position anymore. Although she still does think one of her co-workers was friendly to her, she believes she will be able to handle it well and denies any concern. She talks with her children every day and is excited that they will move to [place]. She also visits her mother who lives in the area.   She denies insomnia. She denies anhedonia. She reports good appetite and does exercise more. She denies SI, HI, AH, VH. She denies paranoia. She denies ideas of reference. She is concerned that her memory might not be 100%, and reports she may have a issue with calicula.   Wt Readings from Last 3 Encounters:  10/14/16 226 lb 9.6 oz (102.8 kg)  09/13/16 228 lb (103.4 kg)  08/17/16 226 lb (102.5 kg)    Visit Diagnosis:    ICD-10-CM   1. Adjustment disorder, unspecified type F43.20     Past Psychiatric History:  I have reviewed the patient's psychiatry history in detail and updated the patient record.  Outpatient: saw [facility] [facility] provider when she got divorced Psychiatry admission: denies Previous suicide attempt: denies Past trials of medication: Abilify  History of violence: denies  Past Medical History:  Past Medical History:  Diagnosis Date  . Allergy   . History of bladder infections   .  Personal history of colonic polyps - pre-cancerous 01/01/2013  . Pre-diabetes   . PVC (premature ventricular contraction)     Past Surgical History:  Procedure Laterality Date  . [place]  . ESOPHAGOGASTRODUODENOSCOPY  2007   Gessner (gastropathy, esophagitis - GERD vs. EE)  . KNEE SURGERY Left   . NASAL SINUS SURGERY    . TONSILLECTOMY  1972  . VAGINAL HYSTERECTOMY  1995    Family Psychiatric History:  I have reviewed the patient's family history in detail and updated the patient record.  Family History:  Family History  Problem Relation Age of Onset  . Arthritis Mother   . COPD Mother   . Cancer Father        leukemia  . Multiple sclerosis Paternal Aunt   . Heart disease Sister        PACs  . Diabetes Brother   . Gout Brother   . Alcohol abuse Brother   . Colon cancer Neg Hx     Social History:  Social History   Social History  . Marital status: Divorced    Spouse name: N/A  . Number of children: N/A  . Years of education: N/A   Social History Main Topics  . Smoking status: Never Smoker  . Smokeless tobacco: Never Used  . Alcohol use No     Comment: 05-24-2016 rarely  . Drug use: No  Comment: 05-24-2016 per pt no  . Sexual activity: Not Currently   Other Topics Concern  . None   Social History Narrative   Work or School: works for [employer]: lives alone      Spiritual Beliefs: Baptist      Lifestyle: walking 5 days; diet is "good"             Allergies:  Allergies  Allergen Reactions  . Orlistat Other (See Comments)    Cognitive problems and neurological problems( memory, speaking, Heart PVC, coordination, vision, impaired discission making)    Metabolic Disorder Labs: No results found for: HGBA1C, MPG No results found for: PROLACTIN No results found for: CHOL, TRIG, HDL, CHOLHDL, VLDL, LDLCALC   Current Medications: Current Outpatient Prescriptions  Medication Sig Dispense Refill  . ARIPiprazole  (ABILIFY) 5 MG tablet Take 1 tablet (5 mg total) by mouth daily. 90 tablet 0  . Cholecalciferol (VITAMIN D3) 2000 UNITS TABS Take by mouth daily.    Marland Kitchen estradiol (ESTRACE) 1 MG tablet Take 1 mg by mouth daily.    . Multiple Vitamin (MULTIVITAMIN) capsule Take 1 capsule by mouth daily.     No current facility-administered medications for this visit.     Neurologic: Headache: No Seizure: No Paresthesias: No  Musculoskeletal: Strength & Muscle Tone: within normal limits Gait & Station: normal Patient leans: N/A  Psychiatric Specialty Exam: Review of Systems  Psychiatric/Behavioral: Positive for memory loss. Negative for depression, hallucinations, substance abuse and suicidal ideas. The patient is not nervous/anxious and does not have insomnia.   All other systems reviewed and are negative.   Blood pressure 126/82, pulse 80, weight 226 lb 9.6 oz (102.8 kg), SpO2 95 %.Body mass index is 38.88 kg/m.  General Appearance: Fairly Groomed  Eye Contact:  Good  Speech:  Clear and Coherent  Volume:  Normal  Mood:  good  Affect:  Appropriate and Congruent  Thought Process:  Coherent and Goal Directed  Orientation:  Full (Time, Place, and Person)  Thought Content: Logical  Perceptions: denies AH/VH  Suicidal Thoughts:  No  Homicidal Thoughts:  No  Memory:  Immediate;   Good Recent;   Good Remote;   Good  Judgement:  Good  Insight:  Good  Psychomotor Activity:  Normal  Concentration:  Concentration: Good and Attention Span: Good  Recall:  Good  Fund of Knowledge: Good  Language: Good  Akathisia:  No  Handed:  Right  AIMS (if indicated):  N/A  Assets:  Communication Skills Desire for Improvement  ADL's:  Intact  Cognition: WNL  Sleep:  good   Assessment Michele Lynch is a 56 year old female with depression, dysphagia. Patient presents for follow up appointment for Adjustment disorder, unspecified type, paranoia.   # Unspecified schizophrenia spectrum and other psychotic  disorder # r/o schizophrenia There has been significant improvement in her rumination on somatic symptoms and disorganized thought process since starting Abilify. No other symptoms concerning for psychotic symptoms. Will continue abilfy at the current dose to target paranoia. 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 Patient is willing to return to work, and she is advised to contact her employer regarding the process to return to work.   # r/o neurocognitive impairment  Patient endorses memory loss despite improvement in psychotic symptoms. Noted that she was diagnosed with unspecified neurocognitive disorder, upon evaluation for fitness for duty by Dr. Yvetta Coder, 11.1.2017. Will consider [place]  at the next evaluation.   Plan 1. Continue Abilify 5 mg daily 2. Return to clinic in one month for 30 mins  The patient demonstrates the following risk factors for suicide: Chronic risk factors for suicide include: psychiatric disorder of r/o schizophrenia. Acute risk factorsfor suicide include: loss (financial, interpersonal, professional). Protective factorsfor this patient include: hope for the future. Considering these factors, the overall suicide risk at this point appears to be low. Patient isappropriate for outpatient follow up.   Treatment Plan Summary:Plan as above  The duration of this appointment visit was 30 minutes of face-to-face time with the patient.  Greater than 50% of this time was spent in counseling, explanation of  diagnosis, planning of further management, and coordination of care.  Norman Clay, MD 10/14/2016, 10:46 AM

## 2016-10-14 NOTE — Patient Instructions (Addendum)
1. Continue Abilify 5 mg daily 2. Return to clinic in one month for 30 mins

## 2016-11-09 NOTE — Progress Notes (Signed)
[facility] MD/PA/NP OP Progress Note  11/11/2016 9:20 AM Michele Lynch  MRN:  [id number]  Chief Complaint:  Chief Complaint    Other; Follow-up     Subjective:  "I feel scared" HPI:  Patient presents for follow up appointment for paranoia. She states that she is very concerned about her memory, as she could not remember the formula as she used to. She does not think she is ready to go back to work. She is"scared" and she is concerned financially and concerned about the future. She also states that she has had "PVC" a few times per day, which she used to have once a month at the most. She feels fatigue, although she is able to make herself do things. She talks about her son, who she contacts with very frequently, who lives in [place]. She denies insomnia. She has good appetite, and is concerned about weight gain. She denies feeling depressed. She denies SI, HI, AH/VH. She denies ideas of reference or paranoia. She feels anxious. She denies panic attacks. ADL/IADL independent.  Wt Readings from Last 3 Encounters:  11/11/16 230 lb 3.2 oz (104.4 kg)  10/14/16 226 lb 9.6 oz (102.8 kg)  09/13/16 228 lb (103.4 kg)    Visit Diagnosis:    ICD-10-CM   1. Schizophrenia, unspecified type ([place]) F20.9     Past Psychiatric History:  I have reviewed the patient's psychiatry history in detail and updated the patient record. Outpatient: saw [facility] provider when she got divorced Psychiatry admission: denies Previous suicide attempt: denies Past trials of medication: Abilify  History of violence: denies  Per record for neuropsychological fitness for duty evaluation written by Dr. Yvetta Coder, Ph.D. 02/17/2016. Details in the scan.  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 There is also a concern raised for possible sleep apnea, and it may be beneficial for her to undergo routine clinical neuropsychological evaluation  # Unspecified schizophrenia spectrum and other psychotic disorder  -Previously diagnosed with schizoaffective disorder by her PCP, Dr. Gar Ponto, first around the time of her divorce approximately in 2007. She has a long history of delusion, disorganized thought and behavior, and somatic hallucinations. Her father has been diagnosed with schizophrenia. Recommended to see psychiatrist for diagnostic work up, treatment and psychosocial rehabilitation service to regain the skills needed to work and live successfully. Any sudden or forced return to work I likely to elicit the same kinds of stressors that initiated her current psychiatric decompensation.  Past Medical History:  Past Medical History:  Diagnosis Date  . Allergy   . History of bladder infections   . Personal history of colonic polyps - pre-cancerous 01/01/2013  . Pre-diabetes   . PVC (premature ventricular contraction)     Past Surgical History:  Procedure Laterality Date  . [place]  . ESOPHAGOGASTRODUODENOSCOPY  2007   Gessner (gastropathy, esophagitis - GERD vs. EE)  . KNEE SURGERY Left   . NASAL SINUS SURGERY    . TONSILLECTOMY  1972  . VAGINAL HYSTERECTOMY  1995    Family Psychiatric History:  Father- schizophrenia, PTSD, brother- PTSD, brother's daughter- autistic, Paternal aunt- MS, paternal aunt's son- MG, sister's son- epilepsy, maternal uncle- epilepsy  Family History:  Family History  Problem Relation Age of Onset  .  Arthritis Mother   . COPD Mother   . Cancer Father        leukemia  . Multiple sclerosis Paternal Aunt   . Heart disease Sister        PACs  . Diabetes Brother   . Gout Brother   . Alcohol abuse Brother   . Colon cancer Neg Hx     Social History:  Social History   Social History  . Marital  status: Divorced    Spouse name: N/A  . Number of children: N/A  . Years of education: N/A   Social History Main Topics  . Smoking status: Never Smoker  . Smokeless tobacco: Never Used  . Alcohol use No     Comment: 05-24-2016 rarely  . Drug use: No     Comment: 05-24-2016 per pt no  . Sexual activity: Not Currently   Other Topics Concern  . Not on file   Social History Narrative   Work or School: works for [employer]: lives alone      Spiritual Beliefs: Baptist      Lifestyle: walking 5 days; diet is "good"             Allergies:  Allergies  Allergen Reactions  . Orlistat Other (See Comments)    Cognitive problems and neurological problems( memory, speaking, Heart PVC, coordination, vision, impaired discission making)    Metabolic Disorder Labs: No results found for: HGBA1C, MPG No results found for: PROLACTIN No results found for: CHOL, TRIG, HDL, CHOLHDL, VLDL, LDLCALC   Current Medications: Current Outpatient Prescriptions  Medication Sig Dispense Refill  . ARIPiprazole (ABILIFY) 5 MG tablet Take 1 tablet (5 mg total) by mouth daily. 90 tablet 0  . Cholecalciferol (VITAMIN D3) 2000 UNITS TABS Take by mouth daily.    Marland Kitchen estradiol (ESTRACE) 1 MG tablet Take 1 mg by mouth daily.    . Multiple Vitamin (MULTIVITAMIN) capsule Take 1 capsule by mouth daily.     No current facility-administered medications for this visit.     Neurologic: Headache: No Seizure: No Paresthesias: No  Musculoskeletal: Strength & Muscle Tone: within normal limits Gait & Station: normal Patient leans: N/A  Psychiatric Specialty Exam: Review of Systems  Cardiovascular: Positive for palpitations.  Psychiatric/Behavioral: Positive for memory loss. Negative for depression, hallucinations, substance abuse and suicidal ideas. The patient is nervous/anxious. The patient does not have insomnia.   All other systems reviewed and are negative.   Blood pressure 130/82, pulse  75, height 5' 4.02" (1.626 m), weight 230 lb 3.2 oz (104.4 kg).Body mass index is 39.49 kg/m.  General Appearance: Fairly Groomed  Eye Contact:  Good  Speech:  Clear and Coherent  Volume:  Normal  Mood:  "scared"  Affect:  Appropriate, Congruent and anxious, reactive  Thought Process:  Coherent  Orientation:  Full (Time, Place, and Person)  Thought Content: Logical Perceptions: denies AH/VH  Suicidal Thoughts:  No  Homicidal Thoughts:  No  Memory:  Immediate;   Good Recent;   Good Remote;   Good  Judgement:  Good  Insight:  Good  Psychomotor Activity:  Normal  Concentration:  Concentration: Good and Attention Span: Good  Recall:  Good  Fund of Knowledge: Good  Language: Good  Akathisia:  No  Handed:  Right  AIMS (if indicated):  N/A  Assets:  Communication Skills Desire for Improvement  ADL's:  Intact  Cognition: WNL  Sleep:  good  MOCA 11/11/2016: 23/30 (-1 for  clock: wrote only one hand, -1 for attention, "93, 89, 82, 72, 62, " -1 for language, -4 for delayed recall (-2 with category cue))  Assessment Michele Lynch is a 56 y.o. year old female with a history of depression, dysphagia , who presents for follow up appointment for Schizophrenia, unspecified type ([place])  # Unspecified schizophrenia spectrum and other psychotic disorder # r/o schizophrenia She has been responding very well to Abilify; no paranoia, rumination or disorganized thought process were observed on today's evaluation. It is difficult to discern whether her "PVC" is partially due to somatic preoccupation, although she does reports history of PVC in the past. She is advised to see PCP for evaluation given frequent symptoms. Will continue Abilify at the current dose to target psychotic symptoms. Will monitor weight gain.   # r/o mild neurocognitive disorder MOCA score on 25/30, which is relatively lower than what is expected based on her education. Differential include neurocognitive impairment secondary  to schizophrenia and/or mild neurocognitive disorder, concern of which was raised by  Dr. Yvetta Coder, Ph.D.in 02/17/2016. Will make referral for neuropsych evaluation for further evaluation. Will consider repeating MRI as needed and labs at the next encounter.    Plan 1. Continue Abilify 5 mg daily 2. Referral to neurocognitive evaluation 3. Return to clinic in two months for 30 mins 4. Contact your PCP about your heat pounding   The patient demonstrates the following risk factors for suicide: Chronic risk factors for suicide include: psychiatric disorder of r/o schizophrenia. Acute risk factorsfor suicide include: loss (financial, interpersonal, professional). Protective factorsfor this patient include: hope for the future. Considering these factors, the overall suicide risk at this point appears to be low. Patient isappropriate for outpatient follow up.  Treatment Plan Summary:Plan as above  The duration of this appointment visit was 30 minutes of face-to-face time with the patient.  Greater than 50% of this time was spent in counseling, explanation of  diagnosis, planning of further management, and coordination of care.  Norman Clay, MD 11/11/2016, 9:20 AM

## 2016-11-11 ENCOUNTER — Ambulatory Visit (INDEPENDENT_AMBULATORY_CARE_PROVIDER_SITE_OTHER): Payer: 59 | Admitting: Psychiatry

## 2016-11-11 VITALS — BP 130/82 | HR 75 | Ht 64.02 in | Wt 230.2 lb

## 2016-11-11 DIAGNOSIS — R413 Other amnesia: Secondary | ICD-10-CM | POA: Diagnosis not present

## 2016-11-11 DIAGNOSIS — Z811 Family history of alcohol abuse and dependence: Secondary | ICD-10-CM | POA: Diagnosis not present

## 2016-11-11 DIAGNOSIS — F209 Schizophrenia, unspecified: Secondary | ICD-10-CM | POA: Diagnosis not present

## 2016-11-11 DIAGNOSIS — R1310 Dysphagia, unspecified: Secondary | ICD-10-CM

## 2016-11-11 MED ORDER — ARIPIPRAZOLE 5 MG PO TABS: 5.0000 mg | ORAL_TABLET | Freq: Every day | ORAL | 0 refills | Status: DC

## 2016-11-11 NOTE — Patient Instructions (Signed)
1. Continue Abilify 5 mg daily 2. Referral to neurocognitive evaluation 3. Return to clinic in two months for 30 mins 4. Contac your PCP about your heat pounding

## 2016-11-30 ENCOUNTER — Telehealth (HOSPITAL_COMMUNITY): Payer: Self-pay | Admitting: Psychiatry

## 2016-11-30 NOTE — Telephone Encounter (Signed)
Filled form for [employer] for group disability insurance, Attending physician behavioral health Statement

## 2016-12-08 ENCOUNTER — Telehealth (HOSPITAL_COMMUNITY): Payer: Self-pay | Admitting: Psychiatry

## 2016-12-08 NOTE — Telephone Encounter (Signed)
Please make referral for neuropsych/cognitive evaluation for r/o neurocognitive disorder

## 2016-12-09 NOTE — Telephone Encounter (Signed)
Called ref office and they stated they will call pt today to sch an appt. Spoke with Maggie.

## 2016-12-09 NOTE — Telephone Encounter (Signed)
noted 

## 2017-01-19 ENCOUNTER — Encounter: Payer: 59 | Attending: Psychology | Admitting: Psychology

## 2017-01-19 DIAGNOSIS — F259 Schizoaffective disorder, unspecified: Secondary | ICD-10-CM | POA: Diagnosis not present

## 2017-01-19 DIAGNOSIS — F09 Unspecified mental disorder due to known physiological condition: Secondary | ICD-10-CM

## 2017-01-19 NOTE — Progress Notes (Signed)
Neuropsychological Consultation   Patient:   Michele Lynch   DOB:   1960/08/08  MR Number:  [id number]  Location:  [facility] [address], [place] [id number] [place] [address] Dept: [phone]           Date of Service:   01/19/2017  Start Time:   10 AM End Time:   11 AM  Provider/Observer:  Ilean Skill, Psy.D.       Clinical Neuropsychologist       Billing Code/Service: [id number] 4 Units  Chief Complaint:    The patient reports that she has had some significant events with significant disturbance in cognitive functioning, expressive language deficits, and vision change. The patient reports that during these 2 instances that she also became very lethargic and had dissociative/delusional types of symptoms during these times. The patient reports that both of these major issues happen during a time when she was taking Orlistat for weight loss. She reports that initially she had the symptoms developed in the mid 2007 again developed been 2017. However, the patient reports that many of these difficulties have improved but she does continue to have ongoing symptoms. The patient has had great difficulty at work and is not able to work right now she is having significant difficulties with attention and concentration and some executive functioning issues. The patient has taken a psychostimulant and feels like it helps.  Reason for Service:  Michele Lynch is a 56 year old female referred by Dr. Modesta Messing, who is her current treating psychiatrist. The patient has been followed by psychiatrist as well as a neurologist through the years. She was diagnosed with schizoaffective disorder in 2011. There is a family history of schizophrenia and there been diagnosis of schizophrenia more recently. The patient reports that she had some significant symptoms developed when she was taking orlistat for  waste loss. She reports that she began to feel very sick and developed vision difficulties and I tracking difficulties. She reports that her coordination of her eyes began to become disturbed. She reports that she had difficulty holding her head straight, had difficulty seeing and would see clouds-like white sensation in her visual field. She experienced significant expressive language deficits. She reports that the words that she desired to say were present in her mind but she could not get them to come out right and she experienced disturbance in her grammatical ordering and word finding difficulties. The patient felt like she had been drugged and she had a very stiff neck which improved after a few days. She experienced short-term memory difficulties. She also experienced swallowing difficulties and then had a abnormal swallowing test being persistent over 2 years. This has improved. She also had a time where she had numb stripped down the left side of her back that lasted for 2 years and then went away. She reported that eating difficulties persisted and had drooling and the muscles in her cheek would not handle food properly. She was tested multiple times for mononucleosis.  She also has a history of Herpes simplex virus type 1.    The patient reports that she began taking orlistat again in September 2017. She reports that she became increasingly delusional and was having blackouts and could not remember. She reports that she had been having problems months before and had an MRI done of her brain August 2017. The results/impressions of this study showed mild nonspecific white matter disease. At the  time, the differential diagnoses include small vessel ischemia, sequela of migraine headaches. There was also a consideration/concern for multiple sclerosis she did have blood work done that did not suggest MS is the culprit. The patient denies any history of migraine type symptoms prior to her difficulties  developing. She did report that she developed increasing problems with vision, memory, executive functioning and word finding issues. She reports that she became increasingly unable to attend and concentrate. She listed a number of difficulties during these times including difficulty reading, words moving related to vision changes. She reports that she had difficulty holding herself head straight, difficulty seeing, difficulty speaking, difficulty with math, abdominal pain that included feelings of being bitten or something large moving around in her intestine. She describes difficulties with spelling and duplicating words when she would type. She describes significant coordination issues. She developed heart symptoms including documented PVCs. She reports that her leg muscles would go rigid with more difficulties on her left leg. She reports that she will be very lethargic and felt drugged. She reports that her hand turned white. She describes these as almost strokelike feelings. She reports that she would have dreamlike sensations even when she was awake. Severe stiffness in her neck that lasted for more than 3 days including difficulty swallowing that lasted for 2 years.  Current Status:  The patient reports that these difficulties have shown steady improvement over the past year. She reports that her ability to read has improved but she continues to have big problems with concentration and attention. Her family doctor feels like her memory has improved significantly but attention and concentration she is now been recently started on psychostimulant. The patient reports that her vision is much better and her word finding issues and retrieval are much better.  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 She describes nervous  twitches as well. She cannot function in her job which requires advanced math skills.   Reliability of Information: Information is provided by direct 101 clinical interview with the patient as well as review of available medical records.  Behavioral Observation: ANNICE JOLLY  presents as a 56 y.o.-year-old Right Caucasian Female who appeared her stated age. her dress was Appropriate and she was Well Groomed and her manners were Appropriate to the situation.  her participation was indicative of Appropriate and Inattentive behaviors.  There were not any physical disabilities noted.  she displayed an appropriate level of cooperation and motivation.     Interactions:    Active Appropriate and Redirectable  Attention:   abnormal and attention span appeared shorter than expected for age  Memory:   within normal limits; recent and remote memory intact  Visuo-spatial:  within normal limits although the patient did describe past symptoms associated with great difficulty with visual tracking and scanning, I coordination, and inability to read at various times.  Speech (Volume):  normal  Speech:   normal; normal  Thought Process:  Coherent and Relevant  Though Content:  WNL; not suicidal  Orientation:   person, place, time/date and situation  Judgment:   Fair  Planning:   Fair  Affect:    Anxious  Mood:    Anxious  Insight:   Good  Intelligence:   high  Marital Status/Living: The patient is divorced and does appear that she went through a divorce  sometime in 2008 2009 with her husband who was involved in extramarital affair. The patient is a 75 year old son and a 86 year old son. She lives by herself in [place]. She was born and raised in [place].  Current Employment: The patient is not currently working due to her cognitive difficulties.  Past Employment:  The patient has a long history of high demanding intellectual work. 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  Substance Use:  No concerns of substance abuse are reported.    Education:   The patient has her bachelors degree in Careers information officer as well as a Conservator, museum/gallery in business administration and a Masters degree in Estate manager/land agent.  Medical History:   Past Medical History:  Diagnosis Date  . Allergy   . History of bladder infections   . Personal history of colonic polyps - pre-cancerous 01/01/2013  . Pre-diabetes   . PVC (premature ventricular contraction)         Abuse/Trauma History: The patient has had some stressors in the past having to do with this and areas around the ending of her marriage. The patient reports that her acute cognitive difficulties and residual difficulties have caused her her ability to work at a job she really enjoyed and was self-reported as being quite good at. However, her employer had to let her go because of the great financial risk errors in her job would pose for the company.  Psychiatric History:  The patient reports that she has had some significant psychiatric type symptoms that started with the development of her symptoms and the mid to late 2000's. There is a significant family psychiatric history. Her father is diagnosed with schizophrenia/schizoaffective disorder as well as epilepsy being present for her uncle as well as sister's children on her mother's side.  Family Med/Psych History:  Family History  Problem Relation Age of Onset  . Arthritis Mother   . COPD Mother   . Cancer Father        leukemia  . Multiple sclerosis Paternal Aunt   . Heart disease Sister        PACs  . Diabetes Brother   . Gout Brother   . Alcohol abuse Brother   . Colon cancer Neg Hx     Risk of Suicide/Violence: low the patient denies any suicidal or homicidal ideation.  Impression/DX:  The patient describes acute changes that occurred on to  primary situations both of which she feels were correlated with taking a medicine for weight loss (orlistat). She reports that she was generally fine before she took this medicine but continued to have worsening worsening cognitive and psychiatric symptoms. The patient reports that after she stopped taking it the first time that her symptoms improved and when she started taking again the symptoms return. She reports that the symptoms have gradually improved at various stages of improvement. Some improved within days and some took as much as 2 years including her swallowing. She does report that she continues to have cognitive deficits related to attention and concentration and executive functioning difficulties.  While I do only have history prior to recent medical data going back to 2008 the patient denies any prior history of symptoms related to schizophrenia, attentional deficits or other significant psychiatric illness. The patient describes a sudden onset of her symptoms. She has been assessed for possible multiple sclerosis which they feel  like they have ruled out. The patient denies any history of migraine activity and the length and duration of some of her symptoms are not a great match for the vascular impact of migraines. However, the patient has tested positive for mononucleosis on several occasions and also has had herpes simplex 1. One possible explanation to the onset of her symptoms and some improvement over time would be possible viral infection related to mononucleosis/herpes simplex 1 impacting brain functions and brain regions. She does have a diagnosis of diabetes or prediabetic which could explain small vessel disease as well. However, these diabetic symptoms are very mild and very early in his progression. It is possible that the orlistat played a role in either worsening her diabetic status or had it intentionally negative impact on her overall immune system due to its effect on locking of  absorption of fat soluble vitamins and/or other nutritional facts. There is also the possibility of bacterial infection such as Lyme's disease, but this may not explain the 2 major episodes several years apart.  Disposition/Plan:  The patient reports that she has had a full neuropsychological evaluation what sounds like a knife neuropsychological evaluation produced and requested by her employer back in 2017. She did not bring the paperwork with her but said she will bring it during her next visit. Initially, we will wait to do a full neuropsychological battery until we can see this data. However, we will look at specific current symptoms related to attention and concentration and executive functioning deficits utilizing the comprehensive attention battery.  Diagnosis:    Cognitive disorder         Electronically Signed   _______________________ Ilean Skill, Psy.D.

## 2017-02-03 NOTE — Progress Notes (Signed)
[facility] MD/PA/NP OP Progress Note  02/07/2017 3:34 PM Michele Lynch  MRN:  [id number]  Chief Complaint:  Chief Complaint    Schizophrenia; Follow-up     HPI:  - Patient is referred to Dr. Sima Matas for neuropsych evaluation.   Patient presents for follow up appointment for schizophrenia. She states that she had visual changes several days ago; it resolved within 30 mins. She is slightly concerned as she experienced the same when she was on Orlistat. She denies any episode since then. She believes that she feels better after started on Abilify several months ago; she was "day dreaming" before starting this medication, stating that she used to have image of people chasing people (not involving the patient.) She also finds it helpful for some abdominal comfort. She also states that she was "not making sense" when she used to see her primary care. She remembered that she believed she saw a dead body in mother's farm years ago. She will retire in December, which was approved by the company. 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 She is applying for a job at [employer]; she hopes that she would do well there. She has difficulty to go through things and she was prescribed on Adderall by her PCP. She denies feeling depressed or anxiety. She exercises every morning. She denies SI, HI, AH/VH. She denies ideas of reference. She denies paranoia. She denies thought insertion.   Wt Readings from Last 3 Encounters:  02/07/17 234 lb (106.1 kg)  11/11/16 230 lb 3.2 oz (104.4 kg)  10/14/16 226 lb 9.6 oz (102.8 kg)     Visit Diagnosis:    ICD-10-CM   1. Schizophrenia, unspecified type ([place]) F20.9     Past Psychiatric History:  I have reviewed the patient's psychiatry history in detail and updated the patient record.  Outpatient: saw [place] provider when she got divorced Psychiatry admission: denies Previous suicide attempt: denies Past trials of  medication: Abilify  History of violence: denies  Per record for neuropsychological fitness for duty evaluation written by Dr. Yvetta Coder, Ph.D. 02/17/2016. Details in the scan.  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 There is also a concern raised for possible sleep apnea, and it may be beneficial for her to undergo routine clinical neuropsychological evaluation  # Unspecified schizophrenia spectrum and other psychotic disorder -Previously diagnosed with schizoaffective disorder by her PCP, Dr. Gar Ponto, first around the time of her divorce approximately in 2007. She has a long history of delusion, disorganized thought and behavior, and somatic hallucinations. Her father has been diagnosed with schizophrenia. Recommended to see psychiatrist for diagnostic work up, treatment and psychosocial rehabilitation service to regain the skills needed to work and live successfully. Any sudden or forced return to work I likely to elicit the same kinds of stressors that initiated her current psychiatric decompensation.  Past Medical History:  Past Medical History:  Diagnosis Date  . Allergy   . History of bladder infections   . Personal history of colonic polyps - pre-cancerous 01/01/2013  . Pre-diabetes   . PVC (premature ventricular contraction)     Past Surgical History:  Procedure Laterality Date  . [place]  . ESOPHAGOGASTRODUODENOSCOPY  2007   Gessner (gastropathy,  esophagitis - GERD vs. EE)  . KNEE SURGERY Left   . NASAL SINUS SURGERY    . TONSILLECTOMY  1972  . VAGINAL HYSTERECTOMY  1995    Family Psychiatric History:  I have reviewed the patient's family history in detail and updated the patient  record.  Father- schizophrenia, PTSD, brother- PTSD, brother's daughter- autistic, Paternal aunt- MS, paternal aunt's son- MG, sister's son- epilepsy, maternal uncle- epilepsy  Family History:  Family History  Problem Relation Age of Onset  . Arthritis Mother   . COPD Mother   . Cancer Father        leukemia  . Multiple sclerosis Paternal Aunt   . Heart disease Sister        PACs  . Diabetes Brother   . Gout Brother   . Alcohol abuse Brother   . Colon cancer Neg Hx     Social History:  Social History   Social History  . Marital status: Divorced    Spouse name: N/A  . Number of children: N/A  . Years of education: N/A   Social History Main Topics  . Smoking status: Never Smoker  . Smokeless tobacco: Never Used  . Alcohol use No     Comment: 05-24-2016 rarely  . Drug use: No     Comment: 05-24-2016 per pt no  . Sexual activity: Not Currently   Other Topics Concern  . None   Social History Narrative   Work or School: works for [employer]: lives alone      Spiritual Beliefs: Baptist      Lifestyle: walking 5 days; diet is "good"             Allergies:  Allergies  Allergen Reactions  . Orlistat Other (See Comments)    Cognitive problems and neurological problems( memory, speaking, Heart PVC, coordination, vision, impaired discission making)    Metabolic Disorder Labs: No results found for: HGBA1C, MPG No results found for: PROLACTIN No results found for: CHOL, TRIG, HDL, CHOLHDL, VLDL, LDLCALC Lab Results  Component Value Date   TSH 1.65 01/07/2014    Therapeutic Level Labs: No results found for: LITHIUM No results found for: VALPROATE No components found for:  CBMZ  Current Medications: Current Outpatient Prescriptions  Medication Sig Dispense Refill  . amphetamine-dextroamphetamine (ADDERALL) 10 MG tablet Take 10 mg by mouth 2 (two) times daily with a meal.    . ARIPiprazole (ABILIFY) 5 MG tablet Take 1 tablet (5 mg total) by  mouth daily. 90 tablet 0  . Cholecalciferol (VITAMIN D3) 2000 UNITS TABS Take by mouth daily.    Marland Kitchen estradiol (ESTRACE) 1 MG tablet Take 1 mg by mouth daily.    . Multiple Vitamin (MULTIVITAMIN) capsule Take 1 capsule by mouth daily.     No current facility-administered medications for this visit.      Musculoskeletal: Strength & Muscle Tone: within normal limits Gait & Station: normal Patient leans: N/A  Psychiatric Specialty Exam: Review of Systems  Psychiatric/Behavioral: Positive for memory loss. Negative for depression, hallucinations, substance abuse and suicidal ideas. The patient is not nervous/anxious and does not have insomnia.   All other systems reviewed and are negative.   Blood pressure (!) 133/97, pulse 96, height 5\' 4"  (1.626 m), weight 234 lb (106.1 kg).Body mass index is 40.17 kg/m.  General Appearance: Fairly Groomed  Eye Contact:  Good  Speech:  Clear and Coherent  Volume:  Normal  Mood:  "good"  Affect:  Appropriate, Congruent and smiles at times  Thought Process:  Coherent and Goal Directed  Orientation:  Full (Time, Place, and Person)  Thought Content: Logical Perceptions: denies AH/VH  Suicidal Thoughts:  No  Homicidal Thoughts:  No  Memory:  Immediate;   Good Recent;   Good Remote;   Good  Judgement:  Good  Insight:  Good  Psychomotor Activity:  Normal  Concentration:  Concentration: Good and Attention Span: Good  Recall:  Good  Fund of Knowledge: Good  Language: Good  Akathisia:  No  Handed:  Right  AIMS (if indicated): not done  Assets:  Communication Skills Desire for Improvement  ADL's:  Intact  Cognition: WNL  Sleep:  Good   Screenings: MOCA 11/11/2016: 23/30 (-1 for clock: wrote only one hand, -1 for attention, "93, 89, 82, 72, 62, " -1 for language, -4 for delayed recall (-2 with category cue))  Assessment and Plan:  Michele Lynch is a 56 y.o. year old female with a history of unspecified schizophrenia spectrum disorder, who  presents for follow up appointment for Schizophrenia, unspecified type ([place])  #unspecified schizophrenia spectrum and other psychotic disorder # r/o schizophrenia She has responded very well to Abilify. She has not demonstrated paranoia, rumination or disorganized thought process which was observed at the initial evaluation. Will continue Abilify at the current dose to target psychotic symptoms. Discussed risk of metabolic syndrome.   # r/o mild neurocognitive disorder MOCA with lower score than expected given her level of education. Differential include neurocognitive impairment due to schizophrenia or mild neurocognitive disorder. Pending neuropsych evaluation; she is advised to hold Adderall at this time until evaluation.   Plan 1. Continue Abilify 5 mg daily 2. Return to clinic in two months for 30 mins 3. Hold Adderall at this time (prescribed by her PCP)   The patient demonstrates the following risk factors for suicide: Chronic risk factors for suicide include: psychiatric disorder of r/o schizophrenia. Acute risk factorsfor suicide include: loss (financial, interpersonal, professional). Protective factorsfor this patient include: hope for the future. Considering these factors, the overall suicide risk at this point appears to be low. Patient isappropriate for outpatient follow up.  The duration of this appointment visit was 30 minutes of face-to-face time with the patient.  Greater than 50% of this time was spent in counseling, explanation of  diagnosis, planning of further management, and coordination of care.  Norman Clay, MD 02/07/2017, 3:34 PM

## 2017-02-07 ENCOUNTER — Encounter (HOSPITAL_COMMUNITY): Payer: Self-pay | Admitting: Psychiatry

## 2017-02-07 ENCOUNTER — Ambulatory Visit (INDEPENDENT_AMBULATORY_CARE_PROVIDER_SITE_OTHER): Payer: 59 | Admitting: Psychiatry

## 2017-02-07 VITALS — BP 133/97 | HR 96 | Ht 64.0 in | Wt 234.0 lb

## 2017-02-07 DIAGNOSIS — Z818 Family history of other mental and behavioral disorders: Secondary | ICD-10-CM

## 2017-02-07 DIAGNOSIS — R419 Unspecified symptoms and signs involving cognitive functions and awareness: Secondary | ICD-10-CM

## 2017-02-07 DIAGNOSIS — F209 Schizophrenia, unspecified: Secondary | ICD-10-CM | POA: Diagnosis not present

## 2017-02-07 DIAGNOSIS — Z810 Family history of intellectual disabilities: Secondary | ICD-10-CM

## 2017-02-07 DIAGNOSIS — R413 Other amnesia: Secondary | ICD-10-CM

## 2017-02-07 MED ORDER — ARIPIPRAZOLE 5 MG PO TABS: 5.0000 mg | ORAL_TABLET | Freq: Every day | ORAL | 0 refills | Status: DC

## 2017-02-07 NOTE — Patient Instructions (Signed)
1. Continue Abilify 5 mg daily 2. Return to clinic in two months for 30 mins 3. Hold adderall at this time

## 2017-02-08 ENCOUNTER — Telehealth (HOSPITAL_COMMUNITY): Payer: Self-pay | Admitting: Psychiatry

## 2017-02-08 NOTE — Telephone Encounter (Signed)
Reviewed labs,  Glu 120, Cre 0.73, LFT wnl, TG 529, Chol 163, HDL 34, HbA1c 6.3% on 9/10. Patient is prescribed on Adderall 10 mg BID, estradiol 1 mg daily

## 2017-03-17 ENCOUNTER — Encounter: Payer: Self-pay | Admitting: Psychology

## 2017-03-17 ENCOUNTER — Encounter: Payer: 59 | Attending: Psychology | Admitting: Psychology

## 2017-03-17 DIAGNOSIS — F259 Schizoaffective disorder, unspecified: Secondary | ICD-10-CM | POA: Insufficient documentation

## 2017-03-17 DIAGNOSIS — F09 Unspecified mental disorder due to known physiological condition: Secondary | ICD-10-CM | POA: Insufficient documentation

## 2017-03-17 NOTE — Progress Notes (Signed)
Today the patient was administered the Wechsler Adult Intelligence Scale-IV as well as the [employer].  The patient also completed the MMPI-2.  Today was 3 hours of testing.  A formal report and scoring will be completed and added to the chart.

## 2017-03-20 ENCOUNTER — Encounter: Payer: Self-pay | Admitting: Psychology

## 2017-03-20 ENCOUNTER — Encounter: Payer: 59 | Attending: Psychology | Admitting: Psychology

## 2017-03-20 DIAGNOSIS — F09 Unspecified mental disorder due to known physiological condition: Secondary | ICD-10-CM | POA: Diagnosis not present

## 2017-03-20 DIAGNOSIS — F259 Schizoaffective disorder, unspecified: Secondary | ICD-10-CM | POA: Diagnosis not present

## 2017-03-20 DIAGNOSIS — F209 Schizophrenia, unspecified: Secondary | ICD-10-CM | POA: Diagnosis not present

## 2017-03-20 NOTE — Progress Notes (Signed)
Patient:  Michele Lynch   DOB: 1960-09-27  MR Number: [id number]  Location: [facility] [address] [place] [address] Dept: [phone]  Start: 3 PM End: 4 PM  Provider/Observer:     Edgardo Roys PSYD  Chief Complaint:      Chief Complaint  Patient presents with  . Memory Loss  . Stress    Reason For Service:     Michele Lynch is a 56 year old female referred by Dr. Modesta Messing, who is her current treating psychiatrist. The patient has been followed by psychiatrist as well as a neurologist through the years. She was diagnosed with schizoaffective disorder in 2011. There is a family history of schizophrenia and there been diagnosis of schizophrenia more recently. The patient reports that she had some significant symptoms developed when she was taking orlistat for waste loss. She reports that she began to feel very sick and developed vision difficulties and I tracking difficulties. She reports that her coordination of her eyes began to become disturbed. She reports that she had difficulty holding her head straight, had difficulty seeing and would see clouds-like white sensation in her visual field. She experienced significant expressive language deficits. She reports that the words that she desired to say were present in her mind but she could not get them to come out right and she experienced disturbance in her grammatical ordering and word finding difficulties. The patient felt like she had been drugged and she had a very stiff neck which improved after a few days. She experienced short-term memory difficulties. She also experienced swallowing difficulties and then had a abnormal swallowing test being persistent over 2 years. This has improved. She also had a time where she had numb stripped down the left side of her back that lasted for 2 years and then went  away. She reported that eating difficulties persisted and had drooling and the muscles in her cheek would not handle food properly. She was tested multiple times for mononucleosis.  She also has a history of Herpes simplex virus type 1.    The patient reports that she began taking orlistat again in September 2017. She reports that she became increasingly delusional and was having blackouts and could not remember. She reports that she had been having problems months before and had an MRI done of her brain August 2017. The results/impressions of this study showed mild nonspecific white matter disease. At the time, the differential diagnoses include small vessel ischemia, sequela of migraine headaches. There was also a consideration/concern for multiple sclerosis she did have blood work done that did not suggest MS is the culprit. The patient denies any history of migraine type symptoms prior to her difficulties developing. She did report that she developed increasing problems with vision, memory, executive functioning and word finding issues. She reports that she became increasingly unable to attend and concentrate. She listed a number of difficulties during these times including difficulty reading, words moving related to vision changes. She reports that she had difficulty holding herself head straight, difficulty seeing, difficulty speaking, difficulty with math, abdominal pain that included feelings of being bitten or something large moving around in her intestine. She describes difficulties with spelling and duplicating words when she would type. She describes significant coordination issues. She developed heart symptoms including documented PVCs. She reports that her leg muscles would go rigid with more difficulties on her left leg. She reports  that she will be very lethargic and felt drugged. She reports that her hand turned white. She describes these as almost strokelike feelings. She reports that she would  have dreamlike sensations even when she was awake. Severe stiffness in her neck that lasted for more than 3 days including difficulty swallowing that lasted for 2 years.  Testing Administered:  The patient was administered the Wechsler Adult Intelligence Scale-IV, the [employer] as well as completing the [place] multiphasic personality inventory-2  Participation Level:   Active  Participation Quality:  Appropriate and Attentive      Behavioral Observation:  Well Groomed, Alert, and Appropriate.   The patient appeared to approach this measure in a very straightforward and honest fashion.  There were no indications that she was either attempting to exaggerate or minimize her clinical symptomatology.  The extended face-to-face interaction with the patient all indicated good effort and the patient appeared to be distressed when she was not doing well and throughout the measures attempted to try her very best.  This does appear to be a fair and valid assessment.  Test Results:   Initially, the patient completed the Wechsler Adult Intelligence Scale-IV.  This specific scores can be found at the end of this report.  Composite Score Summary  Scale Sum of Scaled Scores Composite Score Percentile Rank 95% Conf. Interval Qualitative Description  Verbal Comprehension 42 VCI 122 93 115-127 Superior  Perceptual Reasoning 29 PRI 98 45 92-104 Average  Working Memory 22 WMI 105 63 98-111 Average  Processing Speed 19 PSI 97 42 89-106 Average  Full Scale 112 FSIQ 108 70 104-112 Average  General Ability 71 GAI 111 77 106-116 High Average    The patient produced a full scale IQ score of 108 which falls at the 70th percentile and at the upper end of the average range of global functioning.  Consistent with this measure she produced a general abilities index score of 111 and which falls at the 77th percentile and is in the high average range of functioning.  The patient produced a verbal  comprehension index score of 122 which falls at the 93rd percentile and is in the superior range.  Is very clear that the patient has excellent verbal and comprehension skills.  This is by far her highest area of functioning.  The patient produced average performance relative to normative population with regard to perceptual reasoning and overall visual spatial abilities producing a composite score of 98, which falls at the 45th percentile.  Working Marine scientist produced a composite score of 105 and fell at the 63rd percentile.  With processing speed produced of composite score of 97 and falls at the 42nd percentile.  Overall, these composite scores all suggest that she is functioning in the average to high average range of her general abilities with exceptional abilities with regard to verbal comprehension skills.  Items making up the verbal comprehension subtest show that the patient had excellent scores with regard to verbal reasoning abilities (99th percentile), vocabulary (91st percentile), general fund of information (63rd percentile), and social judgment/comprehension (99th percentile).  Items making up the perceptual reasoning index score include average performances with regard to visual analysis and organization, verbal reasoning and problem-solving, visual estimations and the ability to identify anomalies from Gestalt.  All of these performances fell in the average range of functioning and between the 25th and 63rd percentile.  Individual items making up working memory index score suggest that her auditory encoding abilities/working memory were all in  the average range of functioning ranging from the 37th to the 75th percentile.  Items making up the processing speed index shows some variability but all scores falling in the average to high average range.  Visual scanning, visual searching, and overall speed of mental operations were all in the average to high average range of functioning.  The patient  also completed the [place] Verbal Learning Test.  The patient's memory functioning as far as auditory learning show that she is functioning in the average to high average range for auditory learning and memory.  The patient showed average to high average ability to initially learn new information.  She did benefit from repeated exposure to information showing improvement with each subsequent presentation of information.  The patient showed excellent ability to remember that information and recall it after a period of delay and did not appear to be overly negatively impacted by the distractors or other learning taking place in between initial learning trials.  The patient also completed the [place] multiphasic personality inventory-2.  The resulting validity scales strongly suggest that the patient approach this measure in an honest and straightforward fashion.  There were no indications that the patient neither exaggerated or minimized her current symptomatology and she appears to approach this measuring a very honest and straightforward manner.  The patient did have elevations with regard to basic scale/standard clinical scales with regard to both some mild vague and specific health concerns.  She did have an elevation with regard to depressive symptomatology.  There were no indications of any significant personality disorders or long-standing features of anger or mall adaption.  The patient did have an elevation to a very mild degree with regard to her comfort being around others.  Further analysis utilizing content scales high like this mild increase of focus with regard to health concerns and overall somatic functioning.  There was not an elevation with regard to anxiety, anger, or other long-standing features of maladaptive in.  The patient does have an indication through supplementary scales of some mild degree of social avoidance and a sense of being alienated from others at times.  Further  supplemental scales show that the elevation in her depression index primarily had to do with a feeling like she was experiencing slowed cognitive functioning with also some indication of subjective symptoms of depression of sadness and physical malfunction.  The patient acknowledged a significant amount of malaise and lethargy at times and a number of somatic complaints.  She acknowledges times of lack of mastery over her thoughts and some instances of bizarre or unusual sensory experiences that are somewhat stressful to her when they occur.  There were no indications of PTSD or other long-standing traumas that are having a negative deleterious effect on her.  Both of her PTSD supplementary scales were all in the average range.  There is also no indication of vulnerability towards alcoholism or other substance abuse.  Summary of Results:   The results of this current neuropsychological evaluation do suggest that she is functioning in the average to high average range of cognitive functioning and numerous areas assessed.  The patient's memory at least with regard to auditory memory appeared to be in the average to high average range as well.  There were no areas of cognitive deficits noted and she continues to have particular strengths with regard to verbal reasoning abilities, vocabulary and social judgment/comprehension.  The patient's working memory and attention and concentration appears to be in the average to upper end of  average range.  Also, the patient's MMPI would suggest that there are times where she experiences unusual sensory experiences such as hallucinations or other stress the types of responses.  She is endorsing a number of items associated with depression and other mood/affect changes and some mild concerns about her overall health functioning.  However, with her past history of psychiatric symptoms become exacerbated under times of either stress or various medication interventions and her  family history/past history of mood fluctuations it is likely that the patient has some type of symptoms consistent with schizophrenia/schizoaffective disorder but when she is managing these appropriately with medications or keeping stress under control that the symptoms are well managed and are not overly deleterious to her ongoing day-to-day functioning.  She is clearly had times where she was doing quite well in her work life but has had exacerbations and worsening during times of stress and potentially during efforts at weight loss.  Impression/Diagnosis:   While the patient has a clinical picture that would be consistent with schizophrenia/schizoaffective disorder with symptoms also including depression and agitation will be symptoms appear to be well managed currently and the patient has had long periods of time where she was functioning quite well from a psychiatric standpoint.  When the patient is having psychiatric symptoms her cognitive functioning appears to deteriorate precipitously.  However, both the current cognitive/neuropsychological functioning as well as most of the assessments done last year.  During another neuropsychological evaluation were all consistent with generally average to high average range of functioning.  In fact, the patient's performance on verbal skills such as verbal reasoning, social judgment and comprehension, and vocabulary all fall in the high average to superior range of functioning.  There were no areas of neuropsychological deficits with regard to either cognitive or memory functioning.  The patient appears to be responding quite well to the psychiatric interventions and during our last testing session the patient clearly was looking forward to returning to work and feeling like she was returning back to herself.  As far as ongoing treatment I think it would be wise to continue to manage these underlying psychiatric symptoms related to likely  schizophrenia/schizoaffective disorder that for the most part is well managed and under control.  Understanding triggers and stressors that may exacerbate the symptoms and maintaining ongoing psychiatric care will be very important.  However, the patient's cognitive functioning and interpersonal/social skills would suggest that if these symptoms are well managed she is likely to do quite well in her occupational functioning.  I do think that she is ready to return to work and will likely be successful in her return to work.  It will be important that the patient be aware of these potential difficulties and understand what symptoms may be presented initially if her underlying psychiatric conditions become exacerbated.  Diagnosis:    Axis I: Schizophrenia, unspecified type ([place])   Ilean Skill, Psy.D. Neuropsychologist            WAIS-IV Wechsler Adult Intelligence Scale-Fourth Edition Score Report   Examinee Name Kaziyah Parkison  Date of Report 2017/03/20  Justice Rocher [id number]  Years of Education   Date of Birth Sep 02, 1960  Primary Language   Gender Female  Handedness   Race/Ethnicity   Examiner Name Ilean Skill  Date of Testing 2017/03/17  Age at Testing 53 years 11 months  Retest? No   Comments: Composite Score Summary  Scale Sum of Scaled Scores Composite Score Percentile Rank 95% Conf. Interval Qualitative Description  Verbal  Comprehension 42 VCI 122 93 115-127 Superior  Perceptual Reasoning 29 PRI 98 45 92-104 Average  Working Memory 22 WMI 105 63 98-111 Average  Processing Speed 19 PSI 97 42 89-106 Average  Full Scale 112 FSIQ 108 70 104-112 Average  General Ability 71 GAI 111 77 106-116 High Average  Confidence Intervals are based on the Overall Average SEMs. The [facility] is an optional composite summary score that is less sensitive to the influence of working memory and processing speed. Because working memory and processing speed are vital to a comprehensive  evaluation of cognitive ability, it should be noted that the [facility] does not have the breadth of construct coverage as the FSIQ.      ANALYSIS   Index Level Discrepancy Comparisons  Comparison Score 1 Score 2 Difference Critical Value .05 Significant Difference Y/N Base Rate by Overall Sample  VCI - PRI 122 98 24 7.78 Y 4.2  VCI - WMI 122 105 17 8.31 Y 9.4  VCI - PSI 122 97 25 11.76 Y 6.4  PRI - WMI 98 105 -7 8.81 N 31.8  PRI - PSI 98 97 1 12.12 N 47.0  WMI - PSI 105 97 8 12.47 N 30.1  FSIQ - GAI 108 111 -3 3.29 N 30.3  Base Rate by Overall Sample. Statistical significance (critical value) at the .05 level.  Verbal Comprehension Subtests Summary  Subtest Raw Score Scaled Score Percentile Rank Reference Group Scaled Score SEM  Similarities 35 17 99 17 1.04  Vocabulary 51 14 91 15 0.73  Information 17 11 63 12 0.73  (Comprehension) 35 17 99 18 1.16   Perceptual Reasoning Subtests Summary  Subtest Raw Score Scaled Score Percentile Rank Reference Group Scaled Score SEM  Block Design 42 11 63 9 0.95  Matrix Reasoning 13 8 25 7  0.95  Visual Puzzles 14 10 50 9 0.85  (Figure Weights) 14 11 63 9 0.90  (Picture Completion) 12 9 37 9 1.24   Working Doctor, general practice Raw Score Scaled Score Percentile Rank Reference Group Scaled Score SEM  Digit Span 32 12 75 12 0.73  Arithmetic 14 10 50 10 0.90  (Letter-Number Seq.) 19 9 37 9 1.04   Processing Speed Subtests Summary  Subtest Raw Score Scaled Score Percentile Rank Reference Group Scaled Score SEM  Symbol Search 30 10 50 9 1.56  Coding 60 9 37 8 1.20  (Cancellation) 50 14 91 13 1.62   Subtest Level Discrepancy Comparisons  Subtest Comparison Score 1 Score 2 Difference Critical Value .05 Significant Difference Y/N Base Rate  Digit Span - Arithmetic 12 10 2  2.57 N 29.00  Symbol Search - Coding 10 9 1  3.41 N 42.60  Statistical significance (critical value) at the .05 level.       DETERMINING STRENGTHS AND  WEAKNESSES   Differences Between Subtest and Overall Mean of Subtest Scores  Subtest Subtest Scaled Score Mean Scaled Score Difference Critical Value .05 Strength or Weakness Base Rate  Block Design 11 11.20 -0.20 2.85  >25%  Similarities 17 11.20 5.80 2.82 S <1%  Digit Span 12 11.20 0.80 2.22  >25%  Matrix Reasoning 8 11.20 -3.20 2.54 W 10-15%  Vocabulary 14 11.20 2.80 2.03 S 10-15%  Arithmetic 10 11.20 -1.20 2.73  >25%  Symbol Search 10 11.20 -1.20 3.42  >25%  Visual Puzzles 10 11.20 -1.20 2.71  >25%  Information 11 11.20 -0.20 2.19  >25%  Coding 9 11.20 -2.20 2.97  >25%  Overall: Mean =  11.20, Scatter =  9, Base rate =  16.7 Base Rate for Intersubtest Scatter is reported for 10 Subtests. Statistical significance (critical value) at the .05 level.   PROCESS ANALYSIS   Perceptual Reasoning Process Score Summary  Process Score Raw Score Scaled Score Percentile Rank SEM  Block Design No Time Bonus 40 11 63 1.08    Working Memory Process Score Summary  Process Score Raw Score Scaled Score Percentile Rank Base Rate SEM  Digit Span Forward 10 10 50 -- 1.24  Digit Span Backward 13 15 95 -- 1.12  Digit Span Sequencing 9 11 63 -- 1.27  Longest Digit Span Forward 6 -- -- 79.0 --  Longest Digit Span Backward 7 -- -- 16.5 --  Longest Digit Span Sequence 6 -- -- 66.0 --  Longest Letter-Number Sequence 5 -- -- 76.0 --    Process Level Discrepancy Comparisons  Process Comparison Score 1 Score 2 Difference Critical Value .05 Significant Difference Y/N Base Rate  Block Design - Block Design No Time Bonus 11 11 0 3.08 N   Digit Span Forward - Digit Span Backward 10 15 -5 3.65 Y 5.5  Digit Span Forward - Digit Span Sequencing 10 11 -1 3.60 N 45.2  Digit Span Backward - Digit Span Sequencing 15 11 4  3.56 Y 12.5  Longest Digit Span Forward - Longest Digit Span Backward 6 7 -1 -- -- 1.5  Longest Digit Span Forward - Longest Digit Span Sequence 6 6 0 -- --   Longest Digit Span  Backward - Longest Digit Span Sequence 7 6 1  -- -- 18.5  Statistical significance (critical value) at the .05 level.   Raw Scores  Subtest Score Range Raw Score  Process Score Range Raw Score  Block Design 0-66 42  Block Design No Time Bonus 0-48 40  Similarities 0-36 35  Digit Span Forward 0-16 10  Digit Span 0-48 32  Digit Span Backward 0-16 13  Matrix Reasoning 0-26 13  Digit Span Sequencing 0-16 9  Vocabulary 0-57 51  Longest Digit Span Forward 0, 2-9 6  Arithmetic 0-22 14  Longest Digit Span Backward 0, 2-8 7  Symbol Search 0-60 30  Longest Digit Span Sequence 0, 2-9 6  Visual Puzzles 0-26 14  Longest Letter-Number Seq. 0, 2-8 5  Information 0-26 17      Coding 0-135 60      Letter-Number Seq. 0-30 19      Figure Weights 0-27 14      Comprehension 0-36 35      Cancellation 0-72 50      Picture Completion 0-24 12

## 2017-03-21 ENCOUNTER — Encounter (HOSPITAL_BASED_OUTPATIENT_CLINIC_OR_DEPARTMENT_OTHER): Payer: 59 | Admitting: Psychology

## 2017-03-21 DIAGNOSIS — F209 Schizophrenia, unspecified: Secondary | ICD-10-CM | POA: Diagnosis not present

## 2017-03-21 DIAGNOSIS — F09 Unspecified mental disorder due to known physiological condition: Secondary | ICD-10-CM | POA: Diagnosis not present

## 2017-03-23 ENCOUNTER — Encounter: Payer: Self-pay | Admitting: Psychology

## 2017-03-23 NOTE — Progress Notes (Signed)
Today I provided feedback regarding the results of the recent neuropsychological evaluation.  Below you will see the summary from that formal evaluation and written report.  Further details can be found in the note from the visit associated with 03/20/2017.  Patient was quite understanding and receptive of the overall results and understanding some of the adaptive and coping skills we talked about.  I will see the patient again approximately 1 month.  I do think that she is ready to return to work but will need to closely monitor her psychiatric status so there can be ongoing success in her return to work.    Summary of Results:                        The results of this current neuropsychological evaluation do suggest that she is functioning in the average to high average range of cognitive functioning and numerous areas assessed.  The patient's memory at least with regard to auditory memory appeared to be in the average to high average range as well.  There were no areas of cognitive deficits noted and she continues to have particular strengths with regard to verbal reasoning abilities, vocabulary and social judgment/comprehension.  The patient's working memory and attention and concentration appears to be in the average to upper end of average range.  Also, the patient's MMPI would suggest that there are times where she experiences unusual sensory experiences such as hallucinations or other stress the types of responses.  She is endorsing a number of items associated with depression and other mood/affect changes and some mild concerns about her overall health functioning.  However, with her past history of psychiatric symptoms become exacerbated under times of either stress or various medication interventions and her family history/past history of mood fluctuations it is likely that the patient has some type of symptoms consistent with schizophrenia/schizoaffective disorder but when she is managing these  appropriately with medications or keeping stress under control that the symptoms are well managed and are not overly deleterious to her ongoing day-to-day functioning.  She is clearly had times where she was doing quite well in her work life but has had exacerbations and worsening during times of stress and potentially during efforts at weight loss.  Impression/Diagnosis:                     While the patient has a clinical picture that would be consistent with schizophrenia/schizoaffective disorder with symptoms also including depression and agitation will be symptoms appear to be well managed currently and the patient has had long periods of time where she was functioning quite well from a psychiatric standpoint.  When the patient is having psychiatric symptoms her cognitive functioning appears to deteriorate precipitously.  However, both the current cognitive/neuropsychological functioning as well as most of the assessments done last year.  During another neuropsychological evaluation were all consistent with generally average to high average range of functioning.  In fact, the patient's performance on verbal skills such as verbal reasoning, social judgment and comprehension, and vocabulary all fall in the high average to superior range of functioning.  There were no areas of neuropsychological deficits with regard to either cognitive or memory functioning.  The patient appears to be responding quite well to the psychiatric interventions and during our last testing session the patient clearly was looking forward to returning to work and feeling like she was returning back to herself.  As far as ongoing treatment  I think it would be wise to continue to manage these underlying psychiatric symptoms related to likely schizophrenia/schizoaffective disorder that for the most part is well managed and under control.  Understanding triggers and stressors that may exacerbate the symptoms and maintaining ongoing  psychiatric care will be very important.  However, the patient's cognitive functioning and interpersonal/social skills would suggest that if these symptoms are well managed she is likely to do quite well in her occupational functioning.  I do think that she is ready to return to work and will likely be successful in her return to work.  It will be important that the patient be aware of these potential difficulties and understand what symptoms may be presented initially if her underlying psychiatric conditions become exacerbated.  Diagnosis:                               Axis I: Schizophrenia, unspecified type ([place])

## 2017-04-04 NOTE — Progress Notes (Signed)
[place] MD/PA/NP OP Progress Note  04/06/2017 2:05 PM Michele Lynch  MRN:  [id number]  Chief Complaint:  Chief Complaint    Schizophrenia; Follow-up     HPI:  Reviewed neuropsychiatry assessment by Dr. Sima Matas. Dx: schizophrenia, unspecified.  "the patient's performance on verbal skills such as verbal reasoning, social judgment and comprehension, and vocabulary all fall in the high average to superior range of functioning. There were no areas of neuropsychological deficits with regard to either cognitive or memory functioning"  It was felt that she is ready to return to work while advised to be aware of potential difficulties which may exacerbate her underlying psychiatric conditions.   Patient presents for follow-up appointment for schizophrenia.  She states that she has been doing well.  She is hoping to start work at school.  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 She has difficulty with concentration and inattention; she needs to read a book repeatedly. She is concerned about finances and losing insurance until after she starts a job. She is thinking of retirement from her prior job at the end of this month. She has good Thanksgiving and meets with her children and her ex-husband (divorced in 2009). She states that she has good relationship with her ex-husband, although she does not think they will be back together given his infidelity in the past. She endorses low energy and fatigue.  She has hypersomnia.  She attributes it to sinus infection.  She is more motivated.  She denies SI.  She feels anxious and tense at times.  She denies panic attacks.  She denies HI, AH, VH.  She denies paranoia or ideas of reference.   Visit Diagnosis:    ICD-10-CM   1. Schizophrenia, unspecified type ([place]) F20.9     Past Psychiatric History:  I have reviewed the patient's  psychiatry history in detail and updated the patient record.  Outpatient: saw [facility] provider when she got divorced. She had two neuropsychological evaluation,  first by Dr. Yvetta Coder in 02/17/2016 (Dx # Unspecified neurocognitive disorder, # Unspecified schizophrenia spectrum and other psychotic disorder)and Dr. Sima Matas (Dx.schizophrenia, unspecified) in 03/2017. "The patient's working memory and attention and concentration appears to be in the average to upper end of average range." Psychiatry admission: denies Previous suicide attempt: denies Past trials of medication: Abilify  History of violence: denies  Past Medical History:  Past Medical History:  Diagnosis Date  . Allergy   . History of bladder infections   . Personal history of colonic polyps - pre-cancerous 01/01/2013  . Pre-diabetes   . PVC (premature ventricular contraction)     Past Surgical History:  Procedure Laterality Date  . [place]  . ESOPHAGOGASTRODUODENOSCOPY  2007   Gessner (gastropathy, esophagitis - GERD vs. EE)  . KNEE SURGERY Left   . NASAL SINUS SURGERY    . TONSILLECTOMY  1972  . VAGINAL HYSTERECTOMY  1995    Family Psychiatric History:  I have reviewed the patient's family history in detail and updated the patient record.  Father- schizophrenia, PTSD, brother- PTSD, brother's daughter- autistic, Paternal aunt- MS, paternal aunt's son- MG, sister's son- epilepsy, maternal uncle- epilepsy  Family History:  Family History  Problem Relation Age of Onset  . Arthritis Mother   . COPD Mother   . Cancer Father  leukemia  . Multiple sclerosis Paternal Aunt   . Heart disease Sister        PACs  . Diabetes Brother   . Gout Brother   . Alcohol abuse Brother   . Colon cancer Neg Hx     Social History:  Social History   Socioeconomic History  . Marital status: Divorced    Spouse name: None  . Number of children: None  . Years of education: None  . Highest education  level: None  Social Needs  . Financial resource strain: None  . Food insecurity - worry: None  . Food insecurity - inability: None  . Transportation needs - medical: None  . Transportation needs - non-medical: None  Occupational History  . None  Tobacco Use  . Smoking status: Never Smoker  . Smokeless tobacco: Never Used  Substance and Sexual Activity  . Alcohol use: No    Comment: 05-24-2016 rarely  . Drug use: No    Comment: 05-24-2016 per pt no  . Sexual activity: Not Currently  Other Topics Concern  . None  Social History Narrative   Work or School: works for [employer]: lives alone      Spiritual Beliefs: Baptist      Lifestyle: walking 5 days; diet is "good"             Allergies:  Allergies  Allergen Reactions  . Orlistat Other (See Comments)    Cognitive problems and neurological problems( memory, speaking, Heart PVC, coordination, vision, impaired discission making)    Metabolic Disorder Labs: No results found for: HGBA1C, MPG No results found for: PROLACTIN No results found for: CHOL, TRIG, HDL, CHOLHDL, VLDL, LDLCALC Lab Results  Component Value Date   TSH 1.65 01/07/2014    Therapeutic Level Labs: No results found for: LITHIUM No results found for: VALPROATE No components found for:  CBMZ  Current Medications: Current Outpatient Medications  Medication Sig Dispense Refill  . amphetamine-dextroamphetamine (ADDERALL) 10 MG tablet Take 10 mg by mouth 2 (two) times daily with a meal.    . ARIPiprazole (ABILIFY) 5 MG tablet Take 1 tablet (5 mg total) by mouth daily. 90 tablet 0  . Cholecalciferol (VITAMIN D3) 2000 UNITS TABS Take by mouth daily.    Marland Kitchen estradiol (ESTRACE) 1 MG tablet Take 1 mg by mouth daily.    . Multiple Vitamin (MULTIVITAMIN) capsule Take 1 capsule by mouth daily.     No current facility-administered medications for this visit.      Musculoskeletal: Strength & Muscle Tone: within normal limits Gait & Station:  normal Patient leans: N/A  Psychiatric Specialty Exam: Review of Systems  Psychiatric/Behavioral: Positive for memory loss. Negative for depression, hallucinations, substance abuse and suicidal ideas. The patient is nervous/anxious and has insomnia.   All other systems reviewed and are negative.   Blood pressure (!) 151/89, pulse 84, height 5\' 4"  (1.626 m), weight 234 lb (106.1 kg), SpO2 96 %.Body mass index is 40.17 kg/m.  General Appearance: Fairly Groomed  Eye Contact:  Good  Speech:  Clear and Coherent  Volume:  Normal  Mood:  "good"  Affect:  Appropriate, Congruent and slightly anxious  Thought Process:  Coherent and Goal Directed  Orientation:  Full (Time, Place, and Person)  Thought Content: Logical   Suicidal Thoughts:  No  Homicidal Thoughts:  No  Memory:  Immediate;   Good Recent;   Good Remote;   Good  Judgement:  Good  Insight:  Good  Psychomotor Activity:  Normal  Concentration:  Concentration: Good and Attention Span: Good  Recall:  Good  Fund of Knowledge: Good  Language: Good  Akathisia:  No  Handed:  Right  AIMS (if indicated): not done  Assets:  Communication Skills Desire for Improvement  ADL's:  Intact  Cognition: WNL  Sleep:  hypersomnia   Screenings: [facility] 11/11/2016: 23/30 (-1 for clock: wrote only one hand, -1 for attention, "93, 89, 82, 72, 62, " -1 for language, -4 for delayed recall (-2 with category cue))  Assessment and Plan:  Michele Lynch is a 56 y.o. year old female with a history of unspecified schizophrenia spectrum disorder, who presents for follow up appointment for Schizophrenia, unspecified type ([place])  # Unspecified schizophrenia spectrum and other psychotic disorder # r/o schizophrenia She has responded very well to Abilify, and she as not demonstrated paranoia, rumination or disorganized thought process which was observed at the initial evaluation. Given her prior episodes with psychotic nature and anticipated stress of starting  work, will continue current dose to target psychosis. Discussed metabolic side effect. Provided psycho-education of stress which can impact on mental health.   # Adjustment disorder with anxiety  Patient reports mild anxiety in the setting of financial strain and starting new work. Although the patient also reports inattention , it is difficult to discern in the setting of anxiety. No significant evidence of ADHD nor memory deficits per psychological evaluation. She is advised to hold Adderall at this time.   Plan I have reviewed and updated plans as below 1. Continue Abilify 5 mg daily  2. Return to clinic in three months for 30 mins 3. Hold Adderall at this time (prescribed by her PCP)   The patient demonstrates the following risk factors for suicide: Chronic risk factors for suicide include: psychiatric disorder of r/o schizophrenia. Acute risk factorsfor suicide include: loss (financial, interpersonal, professional). Protective factorsfor this patient include: hope for the future. Considering these factors, the overall suicide risk at this point appears to be low. Patient isappropriate for outpatient follow up.  The duration of this appointment visit was 30 minutes of face-to-face time with the patient.  Greater than 50% of this time was spent in counseling, explanation of  diagnosis, planning of further management, and coordination of care.  Norman Clay, MD 04/06/2017, 2:05 PM

## 2017-04-06 ENCOUNTER — Encounter (HOSPITAL_COMMUNITY): Payer: Self-pay | Admitting: Psychiatry

## 2017-04-06 ENCOUNTER — Ambulatory Visit (INDEPENDENT_AMBULATORY_CARE_PROVIDER_SITE_OTHER): Payer: 59 | Admitting: Psychiatry

## 2017-04-06 VITALS — BP 151/89 | HR 84 | Ht 64.0 in | Wt 234.0 lb

## 2017-04-06 DIAGNOSIS — F419 Anxiety disorder, unspecified: Secondary | ICD-10-CM | POA: Diagnosis not present

## 2017-04-06 DIAGNOSIS — R450 Nervousness: Secondary | ICD-10-CM | POA: Diagnosis not present

## 2017-04-06 DIAGNOSIS — Z818 Family history of other mental and behavioral disorders: Secondary | ICD-10-CM

## 2017-04-06 DIAGNOSIS — Z810 Family history of intellectual disabilities: Secondary | ICD-10-CM | POA: Diagnosis not present

## 2017-04-06 DIAGNOSIS — R413 Other amnesia: Secondary | ICD-10-CM

## 2017-04-06 DIAGNOSIS — G4700 Insomnia, unspecified: Secondary | ICD-10-CM

## 2017-04-06 DIAGNOSIS — F209 Schizophrenia, unspecified: Secondary | ICD-10-CM | POA: Diagnosis not present

## 2017-04-06 DIAGNOSIS — Z811 Family history of alcohol abuse and dependence: Secondary | ICD-10-CM

## 2017-04-06 MED ORDER — ARIPIPRAZOLE 5 MG PO TABS: 5.0000 mg | ORAL_TABLET | Freq: Every day | ORAL | 0 refills | Status: DC

## 2017-04-06 NOTE — Patient Instructions (Signed)
1. Continue Abilify 5 mg daily  2. Return to clinic in three months for 30 mins

## 2017-05-16 ENCOUNTER — Ambulatory Visit: Payer: Self-pay | Admitting: Psychology
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
## 2017-07-05 ENCOUNTER — Ambulatory Visit (HOSPITAL_COMMUNITY): Payer: BC Managed Care – PPO | Admitting: Psychiatry

## 2017-07-05 ENCOUNTER — Encounter (HOSPITAL_COMMUNITY): Payer: Self-pay | Admitting: Psychiatry

## 2017-07-05 VITALS — BP 132/86 | HR 96 | Ht 64.0 in | Wt 232.0 lb

## 2017-07-05 DIAGNOSIS — F209 Schizophrenia, unspecified: Secondary | ICD-10-CM | POA: Diagnosis not present

## 2017-07-05 DIAGNOSIS — Z811 Family history of alcohol abuse and dependence: Secondary | ICD-10-CM

## 2017-07-05 DIAGNOSIS — Z818 Family history of other mental and behavioral disorders: Secondary | ICD-10-CM | POA: Diagnosis not present

## 2017-07-05 MED ORDER — ARIPIPRAZOLE 5 MG PO TABS: 5.0000 mg | ORAL_TABLET | Freq: Every day | ORAL | 0 refills | Status: DC

## 2017-07-05 NOTE — Patient Instructions (Addendum)
1. Continue Abilify 5 mg daily  2.Return to clinic inthree monthsfor 15 mins 

## 2017-07-21 ENCOUNTER — Encounter: Payer: BC Managed Care – PPO | Attending: Psychology | Admitting: Psychology

## 2017-07-21 ENCOUNTER — Encounter: Payer: Self-pay | Admitting: Psychology

## 2017-07-21 DIAGNOSIS — F09 Unspecified mental disorder due to known physiological condition: Secondary | ICD-10-CM

## 2017-07-21 DIAGNOSIS — F209 Schizophrenia, unspecified: Secondary | ICD-10-CM | POA: Diagnosis not present

## 2017-07-21 NOTE — Progress Notes (Signed)
Today was a first therapeutic session that we had regarding the specific stressors and coping issues that the patient has been dealing with.  The patient reports that she continues to be very well managed by her psychiatrist regarding her schizophrenia and mood disorder symptoms.  The patient reports that she is doing very well with her medications.  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  We continue to work on coping skills and strategies around reducing overall stress levels and the patient reports that she has been actively working on the concepts and interventions we have developed.

## 2017-09-14 ENCOUNTER — Ambulatory Visit: Payer: Self-pay | Admitting: Psychology

## 2017-09-28 ENCOUNTER — Encounter: Payer: BC Managed Care – PPO | Attending: Psychology | Admitting: Psychology

## 2017-09-28 DIAGNOSIS — F209 Schizophrenia, unspecified: Secondary | ICD-10-CM | POA: Insufficient documentation

## 2017-09-28 DIAGNOSIS — F09 Unspecified mental disorder due to known physiological condition: Secondary | ICD-10-CM | POA: Diagnosis not present

## 2017-10-02 NOTE — Progress Notes (Signed)
[name] MD/PA/NP OP Progress Note  10/05/2017 9:13 AM Michele Lynch  MRN:  [id number]  Chief Complaint:  Chief Complaint    Follow-up; Paranoid     HPI:  Patient presents for follow-up appointment for unspecified schizophrenia spectrum disorder.  She states that she has been doing well.  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  She hopes to continue her job for 5 years so that she can get pension.  She had poor feedback from the prior semester, when she thought maintenance class. She had never learned it before and she thinks she tried her best to be prepared. Se denies significant issues with concentration or memory loss. Her ex-husband fell and was admitted. He has been sober since then. She is informed that there are available pharmacological treatment if he is interested. She was informed by therapist that people may try to control other once they are in close relationship; she thinks he has a point. She is planning to keep some distance with her ex-husband. She denies depression or anxiety. She feels sad at times as she is unable to afford horses as she used to. She enjoyed riding a Actor. She denies SI. She denies paranoia, or ideas of reference. She denies AH, VH. She denies any somatic symptoms.   Wt Readings from Last 3 Encounters:  10/05/17 229 lb (103.9 kg)  07/05/17 232 lb (105.2 kg)  04/06/17 234 lb (106.1 kg)    Visit Diagnosis:    ICD-10-CM   1. Schizophrenia, unspecified type ([place]) F20.9     Past Psychiatric History: Please see initial evaluation for full details. I have reviewed the history. No updates at this time.     Past Medical History:  Past Medical History:  Diagnosis Date  . Allergy   . History of bladder infections   . Personal history of colonic polyps - pre-cancerous 01/01/2013  . Pre-diabetes   . PVC (premature ventricular contraction)     Past Surgical History:  Procedure Laterality Date  . [place]  .  ESOPHAGOGASTRODUODENOSCOPY  2007   Gessner (gastropathy, esophagitis - GERD vs. EE)  . KNEE SURGERY Left   . NASAL SINUS SURGERY    . TONSILLECTOMY  1972  . VAGINAL HYSTERECTOMY  1995    Family Psychiatric History: Please see initial evaluation for full details. I have reviewed the history. No updates at this time.     Family History:  Family History  Problem Relation Age of Onset  . Arthritis Mother   . COPD Mother   . Cancer Father        leukemia  . Schizophrenia Father   . Post-traumatic stress disorder Father   . Multiple sclerosis Paternal Aunt   . Heart disease Sister        PACs  . Diabetes Brother   . Gout Brother   . Alcohol abuse Brother   . Post-traumatic stress disorder Brother   . Colon cancer Neg Hx     Social History:  Social History   Socioeconomic History  . Marital status: Divorced    Spouse name: Not on file  . Number of children: Not on file  . Years of education: Not on file  . Highest education level: Not on file  Occupational History  . Not on file  Social Needs  . Financial resource strain: Not on file  . Food insecurity:    Worry: Not on file    Inability: Not on file  . Transportation  needs:    Medical: Not on file    Non-medical: Not on file  Tobacco Use  . Smoking status: Never Smoker  . Smokeless tobacco: Never Used  Substance and Sexual Activity  . Alcohol use: No    Comment: 05-24-2016 rarely  . Drug use: No    Comment: 05-24-2016 per pt no  . Sexual activity: Not Currently  Lifestyle  . Physical activity:    Days per week: Not on file    Minutes per session: Not on file  . Stress: Not on file  Relationships  . Social connections:    Talks on phone: Not on file    Gets together: Not on file    Attends religious service: Not on file    Active member of club or organization: Not on file    Attends meetings of clubs or organizations: Not on file    Relationship status: Not on file  Other Topics Concern  . Not on file   Social History Narrative   Work or School: works for [employer]: lives alone      Spiritual Beliefs: Baptist      Lifestyle: walking 5 days; diet is "good"             Allergies:  Allergies  Allergen Reactions  . Orlistat Other (See Comments)    Cognitive problems and neurological problems( memory, speaking, Heart PVC, coordination, vision, impaired discission making)    Metabolic Disorder Labs: No results found for: HGBA1C, MPG No results found for: PROLACTIN No results found for: CHOL, TRIG, HDL, CHOLHDL, VLDL, LDLCALC Lab Results  Component Value Date   TSH 1.65 01/07/2014    Therapeutic Level Labs: No results found for: LITHIUM No results found for: VALPROATE No components found for:  CBMZ  Current Medications: Current Outpatient Medications  Medication Sig Dispense Refill  . ARIPiprazole (ABILIFY) 5 MG tablet Take 1 tablet (5 mg total) by mouth daily. 90 tablet 0  . Cholecalciferol (VITAMIN D3) 2000 UNITS TABS Take by mouth daily.    Marland Kitchen estradiol (ESTRACE) 1 MG tablet Take 1 mg by mouth daily.    . Multiple Vitamin (MULTIVITAMIN) capsule Take 1 capsule by mouth daily.     No current facility-administered medications for this visit.      Musculoskeletal: Strength & Muscle Tone: within normal limits Gait & Station: normal Patient leans: N/A  Psychiatric Specialty Exam: Review of Systems  Psychiatric/Behavioral: Negative for depression, hallucinations, memory loss, substance abuse and suicidal ideas. The patient is not nervous/anxious and does not have insomnia.   All other systems reviewed and are negative.   Blood pressure 137/90, pulse 97, height 5\' 4"  (1.626 m), weight 229 lb (103.9 kg), SpO2 95 %.Body mass index is 39.31 kg/m.  General Appearance: Well Groomed  Eye Contact:  Good  Speech:  Clear and Coherent  Volume:  Normal  Mood:  "good"  Affect:  Appropriate, Congruent and euthymic  Thought Process:  Coherent  Orientation:   Full (Time, Place, and Person)  Thought Content: Logical   Suicidal Thoughts:  No  Homicidal Thoughts:  No  Memory:  Immediate;   Good  Judgement:  Good  Insight:  Good  Psychomotor Activity:  Normal  Concentration:  Concentration: Good and Attention Span: Good  Recall:  Good  Fund of Knowledge: Good  Language: Good  Akathisia:  No  Handed:  Right  AIMS (if indicated): no rigidity, no tremors  Assets:  Communication Skills Desire  for Improvement  ADL's:  Intact  Cognition: WNL  Sleep:  Good   Screenings:  MOCA 11/11/2016: 23/30 (-1 for clock: wrote only one hand, -1 for attention, "93, 89, 82, 72, 62, " -1 for language, -4 for delayed recall (-2 with category cue))   Assessment and Plan:  Michele Lynch is a 57 y.o. year old female with a history of unspecified schizophrenia spectrum disorder, who presents for follow up appointment for Schizophrenia, unspecified type ([place])  # unspecified schizophrenia spectrum disorder, # r/o schizophrenia She has responded very well to Abilify and she continues to demonstrate linear thought process without paranoia.  At the initial interview, she was observed to have paranoia, rumination and disorganized thought process.  Given history of prior similar episode and she reports significant stress due to work, will continue Abilify to avoid relapse in psychotic symptoms. Discussed metabolic side effect.   Plan I have reviewed and updated plans as below 1. Continue Abilify 5 mg daily (on current dose since 05/2016) 2.Return to clinic inthree monthsfor 15 mins  The patient demonstrates the following risk factors for suicide: Chronic risk factors for suicide include: psychiatric disorder of r/o schizophrenia. Acute risk factorsfor suicide include: loss (financial, interpersonal, professional). Protective factorsfor this patient include: hope for the future. Considering these factors, the overall suicide risk at this point appears to be low.  Patient isappropriate for outpatient follow up.  Norman Clay, MD 10/05/2017, 9:13 AM

## 2017-10-04 ENCOUNTER — Encounter: Payer: Self-pay | Admitting: Psychology

## 2017-10-04 NOTE — Progress Notes (Signed)
Today was a follow-up visit with patient .  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  She reports that one of the other teachers is doing things to stress her (from her perspective intentional) and the other teacher wanted to teach her classes but he is not qualified.  The patient reports that she is working with Fre[employer] about this issues, but it is very stressful.  We continued to work on coping and adjustment issues.

## 2017-10-05 ENCOUNTER — Encounter (HOSPITAL_COMMUNITY): Payer: Self-pay | Admitting: Psychiatry

## 2017-10-05 ENCOUNTER — Ambulatory Visit (HOSPITAL_COMMUNITY): Payer: BC Managed Care – PPO | Admitting: Psychiatry

## 2017-10-05 VITALS — BP 137/90 | HR 97 | Ht 64.0 in | Wt 229.0 lb

## 2017-10-05 DIAGNOSIS — F209 Schizophrenia, unspecified: Secondary | ICD-10-CM | POA: Diagnosis not present

## 2017-10-05 MED ORDER — ARIPIPRAZOLE 5 MG PO TABS: 5.0000 mg | ORAL_TABLET | Freq: Every day | ORAL | 0 refills | Status: DC

## 2017-10-05 NOTE — Patient Instructions (Addendum)
1. Continue Abilify 5 mg daily  2.Return to clinic inthree monthsfor 15 mins

## 2017-12-15 ENCOUNTER — Encounter: Payer: Self-pay | Admitting: Psychology

## 2017-12-15 ENCOUNTER — Encounter: Payer: BC Managed Care – PPO | Attending: Psychology | Admitting: Psychology

## 2017-12-15 DIAGNOSIS — F209 Schizophrenia, unspecified: Secondary | ICD-10-CM | POA: Diagnosis not present

## 2017-12-15 DIAGNOSIS — F09 Unspecified mental disorder due to known physiological condition: Secondary | ICD-10-CM | POA: Diagnosis not present

## 2017-12-15 NOTE — Progress Notes (Signed)
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  While it is much less demanding theoretically the patient continues to have struggle reading the material and preparing her classes.  She is doing better but with the start of the new semester she is having a lot of adjustment difficulties.  While the patient is improving relative to where she was at her worst the patient continues to have difficulties and is looking for ways that she can try to attend better and comprehend the new reading that she is doing.  We discussed issues about medication possibilities as one physician is suggested in long-acting amphetamine while her psychiatrist is concerned about this medicine given her psychiatric history.  The patient denies any paranoid or delusional types of thoughts at this time but does describe significant stress and anxiety.

## 2017-12-29 NOTE — Progress Notes (Deleted)
[facility] MD/PA/NP OP Progress Note  12/29/2017 9:49 AM Michele Lynch  MRN:  [id number]  Chief Complaint:  HPI:   Cognition   Visit Diagnosis: No diagnosis found.  Past Psychiatric History: Please see initial evaluation for full details. I have reviewed the history. No updates at this time.     Past Medical History:  Past Medical History:  Diagnosis Date  . Allergy   . History of bladder infections   . Personal history of colonic polyps - pre-cancerous 01/01/2013  . Pre-diabetes   . PVC (premature ventricular contraction)     Past Surgical History:  Procedure Laterality Date  . [place]  . ESOPHAGOGASTRODUODENOSCOPY  2007   Gessner (gastropathy, esophagitis - GERD vs. EE)  . KNEE SURGERY Left   . NASAL SINUS SURGERY    . TONSILLECTOMY  1972  . VAGINAL HYSTERECTOMY  1995    Family Psychiatric History: Please see initial evaluation for full details. I have reviewed the history. No updates at this time.     Family History:  Family History  Problem Relation Age of Onset  . Arthritis Mother   . COPD Mother   . Cancer Father        leukemia  . Schizophrenia Father   . Post-traumatic stress disorder Father   . Multiple sclerosis Paternal Aunt   . Heart disease Sister        PACs  . Diabetes Brother   . Gout Brother   . Alcohol abuse Brother   . Post-traumatic stress disorder Brother   . Colon cancer Neg Hx     Social History:  Social History   Socioeconomic History  . Marital status: Divorced    Spouse name: Not on file  . Number of children: Not on file  . Years of education: Not on file  . Highest education level: Not on file  Occupational History  . Not on file  Social Needs  . Financial resource strain: Not on file  . Food insecurity:    Worry: Not on file    Inability: Not on file  . Transportation needs:    Medical: Not on file    Non-medical: Not on file  Tobacco Use  . Smoking status: Never Smoker  . Smokeless tobacco:  Never Used  Substance and Sexual Activity  . Alcohol use: No    Comment: 05-24-2016 rarely  . Drug use: No    Comment: 05-24-2016 per pt no  . Sexual activity: Not Currently  Lifestyle  . Physical activity:    Days per week: Not on file    Minutes per session: Not on file  . Stress: Not on file  Relationships  . Social connections:    Talks on phone: Not on file    Gets together: Not on file    Attends religious service: Not on file    Active member of club or organization: Not on file    Attends meetings of clubs or organizations: Not on file    Relationship status: Not on file  Other Topics Concern  . Not on file  Social History Narrative   Work or School: works for [employer]: lives alone      Spiritual Beliefs: Baptist      Lifestyle: walking 5 days; diet is "good"             Allergies:  Allergies  Allergen Reactions  . Orlistat Other (See Comments)  Cognitive problems and neurological problems( memory, speaking, Heart PVC, coordination, vision, impaired discission making)    Metabolic Disorder Labs: No results found for: HGBA1C, MPG No results found for: PROLACTIN No results found for: CHOL, TRIG, HDL, CHOLHDL, VLDL, LDLCALC Lab Results  Component Value Date   TSH 1.65 01/07/2014    Therapeutic Level Labs: No results found for: LITHIUM No results found for: VALPROATE No components found for:  CBMZ  Current Medications: Current Outpatient Medications  Medication Sig Dispense Refill  . ARIPiprazole (ABILIFY) 5 MG tablet Take 1 tablet (5 mg total) by mouth daily. 90 tablet 0  . Cholecalciferol (VITAMIN D3) 2000 UNITS TABS Take by mouth daily.    Marland Kitchen estradiol (ESTRACE) 1 MG tablet Take 1 mg by mouth daily.    . Multiple Vitamin (MULTIVITAMIN) capsule Take 1 capsule by mouth daily.     No current facility-administered medications for this visit.      Musculoskeletal: Strength & Muscle Tone: within normal limits Gait & Station:  normal Patient leans: N/A  Psychiatric Specialty Exam: ROS  There were no vitals taken for this visit.There is no height or weight on file to calculate BMI.  General Appearance: Fairly Groomed  Eye Contact:  Good  Speech:  Clear and Coherent  Volume:  Normal  Mood:  {BHH MOOD:22306}  Affect:  {Affect (PAA):22687}  Thought Process:  Coherent  Orientation:  Full (Time, Place, and Person)  Thought Content: Logical   Suicidal Thoughts:  {ST/HT (PAA):22692}  Homicidal Thoughts:  {ST/HT (PAA):22692}  Memory:  Immediate;   Good  Judgement:  {Judgement (PAA):22694}  Insight:  {Insight (PAA):22695}  Psychomotor Activity:  Normal  Concentration:  Concentration: Good and Attention Span: Good  Recall:  Good  Fund of Knowledge: Good  Language: Good  Akathisia:  No  Handed:  Right  AIMS (if indicated): not done  Assets:  Communication Skills Desire for Improvement  ADL's:  Intact  Cognition: WNL  Sleep:  {BHH GOOD/FAIR/POOR:22877}   Screenings:   Assessment and Plan:  Michele Lynch is a 57 y.o. year old female with a history of unspecified schizophrenia spectrum disorder, who presents for follow up appointment for No diagnosis found.   #unspecified schizophrenia spectrum disorder # r/o schizophrenia She has responded very well to Abilify and she continues to demonstrate linear thought process without paranoia.  At the initial interview, she was observed to have paranoia, rumination and disorganized thought process.  Given history of prior similar episode and she reports significant stress due to work, will continue Abilify to avoid relapse in psychotic symptoms. Discussed metabolic side effect.   Plan  1. Continue Abilify 5 mg daily(on current dose since 05/2016) 2.Return to clinic inthree monthsfor15 mins  Norman Clay, MD 12/29/2017, 9:49 AM

## 2018-01-08 ENCOUNTER — Ambulatory Visit (HOSPITAL_COMMUNITY): Payer: Self-pay | Admitting: Psychiatry

## 2018-01-09 ENCOUNTER — Ambulatory Visit (HOSPITAL_COMMUNITY): Payer: BC Managed Care – PPO | Admitting: Psychiatry

## 2018-01-09 ENCOUNTER — Encounter (HOSPITAL_COMMUNITY): Payer: Self-pay | Admitting: Psychiatry

## 2018-01-09 VITALS — BP 140/88 | HR 84 | Ht 64.0 in | Wt 229.0 lb

## 2018-01-09 DIAGNOSIS — Z5689 Other problems related to employment: Secondary | ICD-10-CM

## 2018-01-09 DIAGNOSIS — Z818 Family history of other mental and behavioral disorders: Secondary | ICD-10-CM | POA: Diagnosis not present

## 2018-01-09 DIAGNOSIS — F419 Anxiety disorder, unspecified: Secondary | ICD-10-CM

## 2018-01-09 DIAGNOSIS — Z811 Family history of alcohol abuse and dependence: Secondary | ICD-10-CM | POA: Diagnosis not present

## 2018-01-09 DIAGNOSIS — F209 Schizophrenia, unspecified: Secondary | ICD-10-CM | POA: Diagnosis not present

## 2018-01-09 MED ORDER — ARIPIPRAZOLE 5 MG PO TABS: 5.0000 mg | ORAL_TABLET | Freq: Every day | ORAL | 1 refills | Status: DC

## 2018-01-09 NOTE — Progress Notes (Signed)
[facility] MD/PA/NP OP Progress Note  01/09/2018 9:34 AM Michele Lynch  MRN:  [id number]  Chief Complaint:  Chief Complaint    Follow-up; Schizophrenia     HPI:  Patient presents for follow-up appointment for unspecified schizophreniform disorder.  She states that she has been doing very well.  She does not like her current job, although she tries to like it.  She also talks about her frustration of her supervisor, who tries to let her do more paperwork. She has been having difficulty in reading compared to before. 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 She states that her husband has been abstinent from alcohol since his fall.  They will meet him every weekend.  She feels guarded a little due to the past experience with him. Her children in [place] are doing very well. She hopes to do more exercise and hopes to save money to go to [facility].  She has good sleep.  She denies feeling depressed.  She denies SI.  She feels anxious at times.  She denies panic attacks.  She denies AH, VH.  She denies paranoia.   Labs: 11/2017 TSH 3.8  BMP wnl except Glu 138,  TG 467, TChol 161   Wt Readings from Last 3 Encounters:  01/09/18 229 lb (103.9 kg)  10/05/17 229 lb (103.9 kg)  07/05/17 232 lb (105.2 kg)    Visit Diagnosis:    ICD-10-CM   1. Schizophrenia, unspecified type ([place]) F20.9     Past Psychiatric History: Please see initial evaluation for full details. I have reviewed the history. No updates at this time.     Past Medical History:  Past Medical History:  Diagnosis Date  . Allergy   . History of bladder infections   . Personal history of colonic polyps - pre-cancerous 01/01/2013  . Pre-diabetes   . PVC (premature ventricular contraction)     Past Surgical History:  Procedure Laterality Date  . [place]  . ESOPHAGOGASTRODUODENOSCOPY  2007   Gessner (gastropathy, esophagitis - GERD vs. EE)  . KNEE SURGERY Left   . NASAL SINUS SURGERY    . TONSILLECTOMY   1972  . VAGINAL HYSTERECTOMY  1995    Family Psychiatric History: Please see initial evaluation for full details. I have reviewed the history. No updates at this time.     Family History:  Family History  Problem Relation Age of Onset  . Arthritis Mother   . COPD Mother   . Cancer Father        leukemia  . Schizophrenia Father   . Post-traumatic stress disorder Father   . Multiple sclerosis Paternal Aunt   . Heart disease Sister        PACs  . Diabetes Brother   . Gout Brother   . Alcohol abuse Brother   . Post-traumatic stress disorder Brother   . Colon cancer Neg Hx     Social History:  Social History   Socioeconomic History  . Marital status: Divorced    Spouse name: Not on file  . Number of children: Not on file  . Years of education: Not on file  . Highest education level: Not on file  Occupational History  . Not on file  Social Needs  . Financial resource strain: Not on file  . Food insecurity:    Worry: Not on file    Inability: Not on file  . Transportation needs:    Medical: Not on file  Non-medical: Not on file  Tobacco Use  . Smoking status: Never Smoker  . Smokeless tobacco: Never Used  Substance and Sexual Activity  . Alcohol use: No    Comment: 05-24-2016 rarely  . Drug use: No    Comment: 05-24-2016 per pt no  . Sexual activity: Not Currently  Lifestyle  . Physical activity:    Days per week: Not on file    Minutes per session: Not on file  . Stress: Not on file  Relationships  . Social connections:    Talks on phone: Not on file    Gets together: Not on file    Attends religious service: Not on file    Active member of club or organization: Not on file    Attends meetings of clubs or organizations: Not on file    Relationship status: Not on file  Other Topics Concern  . Not on file  Social History Narrative   Work or School: works for [employer]: lives alone      Spiritual Beliefs: Baptist      Lifestyle:  walking 5 days; diet is "good"             Allergies:  Allergies  Allergen Reactions  . Orlistat Other (See Comments)    Cognitive problems and neurological problems( memory, speaking, Heart PVC, coordination, vision, impaired discission making)    Metabolic Disorder Labs: No results found for: HGBA1C, MPG No results found for: PROLACTIN No results found for: CHOL, TRIG, HDL, CHOLHDL, VLDL, LDLCALC Lab Results  Component Value Date   TSH 1.65 01/07/2014    Therapeutic Level Labs: No results found for: LITHIUM No results found for: VALPROATE No components found for:  CBMZ  Current Medications: Current Outpatient Medications  Medication Sig Dispense Refill  . ARIPiprazole (ABILIFY) 5 MG tablet Take 1 tablet (5 mg total) by mouth daily. 90 tablet 1  . Cholecalciferol (VITAMIN D3) 2000 UNITS TABS Take by mouth daily.    Marland Kitchen estradiol (ESTRACE) 1 MG tablet Take 1 mg by mouth daily.    . Multiple Vitamin (MULTIVITAMIN) capsule Take 1 capsule by mouth daily.     No current facility-administered medications for this visit.      Musculoskeletal: Strength & Muscle Tone: within normal limits Gait & Station: normal Patient leans: N/A  Psychiatric Specialty Exam: Review of Systems  Psychiatric/Behavioral: Negative for depression, hallucinations, memory loss, substance abuse and suicidal ideas. The patient is nervous/anxious. The patient does not have insomnia.   All other systems reviewed and are negative.   Blood pressure 140/88, pulse 84, height 5\' 4"  (1.626 m), weight 229 lb (103.9 kg), SpO2 97 %.Body mass index is 39.31 kg/m.  General Appearance: Fairly Groomed  Eye Contact:  Good  Speech:  Clear and Coherent  Volume:  Normal  Mood:  "good"  Affect:  Appropriate, Congruent and Full Range  Thought Process:  Coherent  Orientation:  Full (Time, Place, and Person)  Thought Content: Logical   Suicidal Thoughts:  No  Homicidal Thoughts:  No  Memory:  Immediate;   Good   Judgement:  Good  Insight:  Good  Psychomotor Activity:  Normal  Concentration:  Concentration: Good and Attention Span: Good  Recall:  Good  Fund of Knowledge: Good  Language: Good  Akathisia:  No  Handed:  Right  AIMS (if indicated): no resting tremor  Assets:  Communication Skills Desire for Improvement  ADL's:  Intact  Cognition: WNL  Sleep:  Good   Screenings:   Assessment and Plan:  Michele Lynch is a 57 y.o. year old female with a history of unspecified schizophrenia spectrum disorder, who presents for follow up appointment for Schizophrenia, unspecified type ([place])  #unspecified schizophrenia spectrum disorder  # r/o schizophrenia She has responded to Abilify very well and continues to demonstrate linear thought process without paranoia.  At the initial interview, she was observed to have paranoia, rumination and disorganized thought process.  Given her history of similar episode in the past, will continue Abilify to avoid relapsing psychotic symptoms.  Discussed potential metabolic side effect.   Plan I have reviewed and updated plans as below 1. Continue Abilify 5 mg daily(on current dose since 05/2016) 2.Return to clinic inthree monthsfor15 mins  The patient demonstrates the following risk factors for suicide: Chronic risk factors for suicide include: psychiatric disorder of r/o schizophrenia. Acute risk factorsfor suicide include: loss (financial, interpersonal, professional). Protective factorsfor this patient include: hope for the future. Considering these factors, the overall suicide risk at this point appears to be low. Patient isappropriate for outpatient follow up.  Norman Clay, MD 01/09/2018, 9:34 AM

## 2018-01-09 NOTE — Patient Instructions (Signed)
1. Continue Abilify 5 mg daily(on current dose since 05/2016) 2.Return to clinic in six  monthsfor15 mins

## 2018-01-26 ENCOUNTER — Encounter: Payer: BC Managed Care – PPO | Attending: Psychology | Admitting: Psychology

## 2018-01-26 DIAGNOSIS — F09 Unspecified mental disorder due to known physiological condition: Secondary | ICD-10-CM | POA: Diagnosis not present

## 2018-01-26 DIAGNOSIS — F209 Schizophrenia, unspecified: Secondary | ICD-10-CM | POA: Diagnosis not present

## 2018-01-31 ENCOUNTER — Encounter: Payer: Self-pay | Admitting: Psychology

## 2018-01-31 NOTE — Progress Notes (Signed)
Today we continue to work on therapeutic interventions around building better adjustment skills specifically related to her anxiety and stress due to her underlying schizophrenia diagnosis.  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

## 2018-03-30 ENCOUNTER — Encounter: Payer: BC Managed Care – PPO | Admitting: Psychology

## 2018-04-23 ENCOUNTER — Encounter: Payer: Self-pay | Admitting: Internal Medicine

## 2018-06-01 ENCOUNTER — Encounter: Payer: Self-pay | Admitting: Psychology

## 2018-06-01 ENCOUNTER — Encounter: Payer: BC Managed Care – PPO | Attending: Psychology | Admitting: Psychology

## 2018-06-01 DIAGNOSIS — F209 Schizophrenia, unspecified: Secondary | ICD-10-CM | POA: Diagnosis not present

## 2018-06-01 DIAGNOSIS — F09 Unspecified mental disorder due to known physiological condition: Secondary | ICD-10-CM

## 2018-06-01 DIAGNOSIS — F439 Reaction to severe stress, unspecified: Secondary | ICD-10-CM | POA: Diagnosis not present

## 2018-06-01 DIAGNOSIS — F419 Anxiety disorder, unspecified: Secondary | ICD-10-CM | POA: Diagnosis present

## 2018-06-01 DIAGNOSIS — R451 Restlessness and agitation: Secondary | ICD-10-CM | POA: Insufficient documentation

## 2018-06-01 NOTE — Progress Notes (Signed)
Today we continue to work on therapeutic interventions around her anxiety, agitation and stress levels.  The patient has continued to do well as far as her mood stability but that she does continue to have stressful situations at work.  The patient reports that there have been situations at work that were not related to her performance but to 1 of her coworkers does lead her to be stressed but reinforces the fact that some of her fears and worries in stressors in the past have not been related to issues about her but issues about this other individual.  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  The issue is far as her work related difficulties have settled down it looks like it is coming to the resolution.  We will follow-up with the patient in a couple of months to continue to make sure we monitor her stability and continued effective work.

## 2018-07-06 NOTE — Progress Notes (Signed)
Virtual Visit via Video Note  I connected with Michele Lynch on 07/12/18 at  8:00 AM EDT by a video enabled telemedicine application and verified that I am speaking with the correct person using two identifiers.   I discussed the limitations of evaluation and management by telemedicine and the availability of in person appointments. The patient expressed understanding and agreed to proceed.     I discussed the assessment and treatment plan with the patient. The patient was provided an opportunity to ask questions and all were answered. The patient agreed with the plan and demonstrated an understanding of the instructions.   The patient was advised to call back or seek an in-person evaluation if the symptoms worsen or if the condition fails to improve as anticipated.  I provided 15 minutes of non-face-to-face time during this encounter.   Norman Clay, MD    [facility] MD/PA/NP OP Progress Note  07/12/2018 8:27 AM Michele Lynch  MRN:  [id number]  Chief Complaint:  Chief Complaint    Follow-up; Schizophrenia     HPI:  This is a follow-up for unspecified schizophreniform disorder.  She states that she has been doing very well.  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  She has been very busy at work.  She reports good relationship with her ex-husband; although he asked the patient to remarry with the patient, she declined at this time.  However, they stay in good relationship.  She has started regular exercise, and is mindful about her diet.  She denies any paranoia.  She denies AH, VH.  She denies feeling depressed or anxiety.  She denies SI.  She denies panic attacks.  She is willing to continue Abilify as she believes she needs this medication.  She also would like to continue to see this note Probation officer, although her family physician offered to take care of her medication.   230 lbs, measure by patient at home  Visit Diagnosis:    ICD-10-CM    1. Schizophrenia, unspecified type ([place]) F20.9     Past Psychiatric History: Please see initial evaluation for full details. I have reviewed the history. No updates at this time.     Past Medical History:  Past Medical History:  Diagnosis Date  . Allergy   . History of bladder infections   . Personal history of colonic polyps - pre-cancerous 01/01/2013  . Pre-diabetes   . PVC (premature ventricular contraction)     Past Surgical History:  Procedure Laterality Date  . [place]  . ESOPHAGOGASTRODUODENOSCOPY  2007   Gessner (gastropathy, esophagitis - GERD vs. EE)  . KNEE SURGERY Left   . NASAL SINUS SURGERY    . TONSILLECTOMY  1972  . VAGINAL HYSTERECTOMY  1995    Family Psychiatric History: Please see initial evaluation for full details. I have reviewed the history. No updates at this time.     Family History:  Family History  Problem Relation Age of Onset  . Arthritis Mother   . COPD Mother   . Cancer Father        leukemia  . Schizophrenia Father   . Post-traumatic stress disorder Father   . Multiple sclerosis Paternal Aunt   . Heart disease Sister        PACs  . Diabetes Brother   . Gout Brother   . Alcohol abuse Brother   . Post-traumatic stress disorder Brother   . Colon cancer Neg Hx  Social History:  Social History   Socioeconomic History  . Marital status: Divorced    Spouse name: Not on file  . Number of children: Not on file  . Years of education: Not on file  . Highest education level: Not on file  Occupational History  . Not on file  Social Needs  . Financial resource strain: Not on file  . Food insecurity:    Worry: Not on file    Inability: Not on file  . Transportation needs:    Medical: Not on file    Non-medical: Not on file  Tobacco Use  . Smoking status: Never Smoker  . Smokeless tobacco: Never Used  Substance and Sexual Activity  . Alcohol use: No    Comment: 05-24-2016 rarely  . Drug use: No     Comment: 05-24-2016 per pt no  . Sexual activity: Not Currently  Lifestyle  . Physical activity:    Days per week: Not on file    Minutes per session: Not on file  . Stress: Not on file  Relationships  . Social connections:    Talks on phone: Not on file    Gets together: Not on file    Attends religious service: Not on file    Active member of club or organization: Not on file    Attends meetings of clubs or organizations: Not on file    Relationship status: Not on file  Other Topics Concern  . Not on file  Social History Narrative   Work or School: works for [employer]: lives alone      Spiritual Beliefs: Baptist      Lifestyle: walking 5 days; diet is "good"             Allergies:  Allergies  Allergen Reactions  . Orlistat Other (See Comments)    Cognitive problems and neurological problems( memory, speaking, Heart PVC, coordination, vision, impaired discission making)    Metabolic Disorder Labs: No results found for: HGBA1C, MPG No results found for: PROLACTIN No results found for: CHOL, TRIG, HDL, CHOLHDL, VLDL, LDLCALC Lab Results  Component Value Date   TSH 1.65 01/07/2014    Therapeutic Level Labs: No results found for: LITHIUM No results found for: VALPROATE No components found for:  CBMZ  Current Medications: Current Outpatient Medications  Medication Sig Dispense Refill  . ARIPiprazole (ABILIFY) 5 MG tablet Take 1 tablet (5 mg total) by mouth daily. 90 tablet 1  . Cholecalciferol (VITAMIN D3) 2000 UNITS TABS Take by mouth daily.    Marland Kitchen estradiol (ESTRACE) 1 MG tablet Take 1 mg by mouth daily.    . Multiple Vitamin (MULTIVITAMIN) capsule Take 1 capsule by mouth daily.     No current facility-administered medications for this visit.      Musculoskeletal: Strength & Muscle Tone: within normal limits Gait & Station: normal Patient leans: N/A  Psychiatric Specialty Exam: Review of Systems  Psychiatric/Behavioral: Negative for  depression, hallucinations, memory loss, substance abuse and suicidal ideas. The patient is not nervous/anxious and does not have insomnia.   All other systems reviewed and are negative.   There were no vitals taken for this visit.There is no height or weight on file to calculate BMI. virtual visit  General Appearance: Fairly Groomed  Eye Contact:  Good  Speech:  Clear and Coherent  Volume:  Normal  Mood:  "good"  Affect:  Appropriate and Congruent  Thought Process:  Coherent  Orientation:  Full (  Time, Place, and Person)  Thought Content: Logical   Suicidal Thoughts:  No  Homicidal Thoughts:  No  Memory:  Immediate;   Good  Judgement:  Good  Insight:  Good  Psychomotor Activity:  Normal  Concentration:  Concentration: Good and Attention Span: Good  Recall:  Good  Fund of Knowledge: Good  Language: Good  Akathisia:  No  Handed:  Right  AIMS (if indicated): not done  Assets:  Communication Skills Desire for Improvement  ADL's:  Intact  Cognition: WNL  Sleep:  Good   Screenings:  Assessment and Plan:  Michele Lynch is a 58 y.o. year old female with a history of unspecified schizophrenia spectrum disorder, who presents for follow up appointment for Schizophrenia, unspecified type ([place])  # Unspecified schizophrenia spectrum disorder # r/o schizophrenia  She has responded to Abilify very well, and continues to demonstrate linear thought process without paranoia.  Noted that she was observed to have paranoia, formulation and disorganized thought process at the initial visit.  Given she did have similar episode in the past, the plan is to continue Abilify to avoid relapse in psychotic symptoms.  Will continue current dose of Abilify.  Discussed potential metabolic side effect and EPS.   Plan I have reviewed and updated plans as below 1. Continue Abilify 5 mg daily(on current dose since 05/2016) 2.Return to clinic insix monthsfor15 mins  The patient demonstrates the  following risk factors for suicide: Chronic risk factors for suicide include: psychiatric disorder of r/o schizophrenia. Acute risk factorsfor suicide include: loss (financial, interpersonal, professional). Protective factorsfor this patient include: hope for the future. Considering these factors, the overall suicide risk at this point appears to be low. Patient isappropriate for outpatient follow up.  Norman Clay, MD 07/12/2018, 8:27 AM

## 2018-07-10 ENCOUNTER — Ambulatory Visit (HOSPITAL_COMMUNITY): Payer: BC Managed Care – PPO | Admitting: Psychiatry

## 2018-07-12 ENCOUNTER — Other Ambulatory Visit: Payer: Self-pay

## 2018-07-12 ENCOUNTER — Ambulatory Visit (INDEPENDENT_AMBULATORY_CARE_PROVIDER_SITE_OTHER): Payer: BC Managed Care – PPO | Admitting: Psychiatry

## 2018-07-12 ENCOUNTER — Encounter (HOSPITAL_COMMUNITY): Payer: Self-pay | Admitting: Psychiatry

## 2018-07-12 DIAGNOSIS — Z79899 Other long term (current) drug therapy: Secondary | ICD-10-CM | POA: Diagnosis not present

## 2018-07-12 DIAGNOSIS — F29 Unspecified psychosis not due to a substance or known physiological condition: Secondary | ICD-10-CM

## 2018-07-12 DIAGNOSIS — F209 Schizophrenia, unspecified: Secondary | ICD-10-CM

## 2018-07-12 MED ORDER — ARIPIPRAZOLE 5 MG PO TABS: 5.0000 mg | ORAL_TABLET | Freq: Every day | ORAL | 1 refills | Status: DC

## 2018-07-12 NOTE — Patient Instructions (Addendum)
1. Continue Abilify 5 mg daily 2.Return to clinic infour to six monthsfor15 mins

## 2019-01-09 NOTE — Progress Notes (Signed)
Virtual Visit via Video Note  I connected with Michele Lynch on 01/14/19 at  8:00 AM EDT by a video enabled telemedicine application and verified that I am speaking with the correct person using two identifiers.   I discussed the limitations of evaluation and management by telemedicine and the availability of in person appointments. The patient expressed understanding and agreed to proceed.     I discussed the assessment and treatment plan with the patient. The patient was provided an opportunity to ask questions and all were answered. The patient agreed with the plan and demonstrated an understanding of the instructions.   The patient was advised to call back or seek an in-person evaluation if the symptoms worsen or if the condition fails to improve as anticipated.  I provided 15 minutes of non-face-to-face time during this encounter.   Norman Clay, MD    [facility] MD/PA/NP OP Progress Note  01/14/2019 8:26 AM Michele Lynch  MRN:  [id number]  Chief Complaint:  Chief Complaint    Depression; Follow-up     HPI:  This is a follow-up appointment for schizophrenia.  She states that she has been feeling depressed and tired since September 3.  She was seen by her PCP.  She was found to have sinus infection.  Although she feels slightly better after she is prescribed Augmentin, she continues to have hypersomnia and fatigue.  She is also found to have some abnormality in thyroid, which she will recheck in a few months.  She feels stressed at work.  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  She does not like the job.  When inquired, she reports a pattern of feeling stressed when she had an episode of psychosis before.  She is concerned what might happen, and is amenable to change her medication.  She reports good relationship with her children.  They will talk every Friday, and text with each other every day.  She works 2-10 pm.  She has hypersomnia.   She feels fatigue.  She has fair concentration.  She has mild anhedonia.  She denies SI, HI, AH, VH.  She denies paranoia, or ideas of reference.  She denies panic attacks.    Wt Readings from Last 3 Encounters:  01/09/18 229 lb (103.9 kg)  10/05/17 229 lb (103.9 kg)  07/05/17 232 lb (105.2 kg)    Visit Diagnosis:    ICD-10-CM   1. Schizophrenia, unspecified type ([place])  F20.9     Past Psychiatric History: Please see initial evaluation for full details. I have reviewed the history. No updates at this time.     Past Medical History:  Past Medical History:  Diagnosis Date  . Allergy   . History of bladder infections   . Personal history of colonic polyps - pre-cancerous 01/01/2013  . Pre-diabetes   . PVC (premature ventricular contraction)     Past Surgical History:  Procedure Laterality Date  . [place]  . ESOPHAGOGASTRODUODENOSCOPY  2007   Gessner (gastropathy, esophagitis - GERD vs. EE)  . KNEE SURGERY Left   . NASAL SINUS SURGERY    . TONSILLECTOMY  1972  . VAGINAL HYSTERECTOMY  1995    Family Psychiatric History: Please see initial evaluation for full details. I have reviewed the history. No updates at this time.     Family History:  Family History  Problem Relation Age of Onset  . Arthritis Mother   . COPD Mother   .  Cancer Father        leukemia  . Schizophrenia Father   . Post-traumatic stress disorder Father   . Multiple sclerosis Paternal Aunt   . Heart disease Sister        PACs  . Diabetes Brother   . Gout Brother   . Alcohol abuse Brother   . Post-traumatic stress disorder Brother   . Colon cancer Neg Hx     Social History:  Social History   Socioeconomic History  . Marital status: Divorced    Spouse name: Not on file  . Number of children: Not on file  . Years of education: Not on file  . Highest education level: Not on file  Occupational History  . Not on file  Social Needs  . Financial resource strain: Not on  file  . Food insecurity    Worry: Not on file    Inability: Not on file  . Transportation needs    Medical: Not on file    Non-medical: Not on file  Tobacco Use  . Smoking status: Never Smoker  . Smokeless tobacco: Never Used  Substance and Sexual Activity  . Alcohol use: No    Comment: 05-24-2016 rarely  . Drug use: No    Comment: 05-24-2016 per pt no  . Sexual activity: Not Currently  Lifestyle  . Physical activity    Days per week: Not on file    Minutes per session: Not on file  . Stress: Not on file  Relationships  . Social Herbalist on phone: Not on file    Gets together: Not on file    Attends religious service: Not on file    Active member of club or organization: Not on file    Attends meetings of clubs or organizations: Not on file    Relationship status: Not on file  Other Topics Concern  . Not on file  Social History Narrative   Work or School: works for [employer]: lives alone      Spiritual Beliefs: Baptist      Lifestyle: walking 5 days; diet is "good"             Allergies:  Allergies  Allergen Reactions  . Orlistat Other (See Comments)    Cognitive problems and neurological problems( memory, speaking, Heart PVC, coordination, vision, impaired discission making)    Metabolic Disorder Labs: No results found for: HGBA1C, MPG No results found for: PROLACTIN No results found for: CHOL, TRIG, HDL, CHOLHDL, VLDL, LDLCALC Lab Results  Component Value Date   TSH 1.65 01/07/2014    Therapeutic Level Labs: No results found for: LITHIUM No results found for: VALPROATE No components found for:  CBMZ  Current Medications: Current Outpatient Medications  Medication Sig Dispense Refill  . ARIPiprazole (ABILIFY) 10 MG tablet Take 1 tablet (10 mg total) by mouth daily. 30 tablet 0  . ARIPiprazole (ABILIFY) 5 MG tablet Take 1 tablet (5 mg total) by mouth daily. 90 tablet 1  . Cholecalciferol (VITAMIN D3) 2000 UNITS TABS Take  by mouth daily.    Marland Kitchen estradiol (ESTRACE) 1 MG tablet Take 1 mg by mouth daily.    . Multiple Vitamin (MULTIVITAMIN) capsule Take 1 capsule by mouth daily.     No current facility-administered medications for this visit.      Musculoskeletal: Strength & Muscle Tone: N/A Gait & Station: N/A Patient leans: N/A  Psychiatric Specialty Exam: Review of Systems  Psychiatric/Behavioral: Negative for depression, hallucinations, memory loss, substance abuse and suicidal ideas. The patient is not nervous/anxious and does not have insomnia.   All other systems reviewed and are negative.   There were no vitals taken for this visit.There is no height or weight on file to calculate BMI.  General Appearance: Fairly Groomed  Eye Contact:  Good  Speech:  Clear and Coherent  Volume:  Normal  Mood:  "tired"  Affect:  Appropriate, Congruent and fatigue, slightly down  Thought Process:  Coherent  Orientation:  Full (Time, Place, and Person)  Thought Content: Logical   Suicidal Thoughts:  No  Homicidal Thoughts:  No  Memory:  Immediate;   Good  Judgement:  Good  Insight:  Fair  Psychomotor Activity:  Normal  Concentration:  Concentration: Good and Attention Span: Good  Recall:  Good  Fund of Knowledge: Good  Language: Good  Akathisia:  No  Handed:  Right  AIMS (if indicated): not done  Assets:  Communication Skills Desire for Improvement  ADL's:  Intact  Cognition: WNL  Sleep:  Poor, hypersomnia   Screenings:   Assessment and Plan:  ALLEXIS GELHAUS is a 58 y.o. year old female with a history of unspecified schizophrenia spectrum disorder, who presents for follow up appointment for Schizophrenia, unspecified type ([place])  # unspecified schizophrenia spectrum disorder # r/o schizophrenia She reports worsening in depressive symptoms with prominent hypersomnia, fatigue for a month.  It coincided with stress at school, and recent sinus infection and possible issues with thyroid (which she  will recheck in a few months according to the patient).  Given she reports a pattern of stress followed by an episode of psychotic-like symptoms, will uptitrate Abilify to target schizophrenia.  Discussed potential risk of EPS, drowsiness, and metabolic side effect.  Will consider adding antidepressant in the future if she has limited benefit from the medication. Noted that she was observed to have paranoia, formulation and disorganized thought process at the initial visit, although she has not displayed any on today's evaluation.   Plan 1. Increase Abilify 10 mg daily  2. Next appointment: 10/28 at 9:20  AM for 30 mins, video   The patient demonstrates the following risk factors for suicide: Chronic risk factors for suicide include: psychiatric disorder of r/o schizophrenia. Acute risk factorsfor suicide include: loss (financial, interpersonal, professional). Protective factorsfor this patient include: hope for the future. Considering these factors, the overall suicide risk at this point appears to be low. Patient isappropriate for outpatient follow up.  Norman Clay, MD 01/14/2019, 8:26 AM

## 2019-01-14 ENCOUNTER — Ambulatory Visit (INDEPENDENT_AMBULATORY_CARE_PROVIDER_SITE_OTHER): Payer: BC Managed Care – PPO | Admitting: Psychiatry

## 2019-01-14 ENCOUNTER — Encounter (HOSPITAL_COMMUNITY): Payer: Self-pay | Admitting: Psychiatry

## 2019-01-14 ENCOUNTER — Other Ambulatory Visit: Payer: Self-pay

## 2019-01-14 DIAGNOSIS — F209 Schizophrenia, unspecified: Secondary | ICD-10-CM | POA: Diagnosis not present

## 2019-01-14 MED ORDER — ARIPIPRAZOLE 10 MG PO TABS: 10.0000 mg | ORAL_TABLET | Freq: Every day | ORAL | 0 refills | Status: DC

## 2019-01-14 NOTE — Patient Instructions (Addendum)
1. Increase Abilify 10 mg daily  2. Next appointment: 10/28 at 9:20  AM

## 2019-02-06 NOTE — Progress Notes (Signed)
Virtual Visit via Video Note  I connected with Michele Lynch on 02/13/19 at  9:20 AM EDT by a video enabled telemedicine application and verified that I am speaking with the correct person using two identifiers.   I discussed the limitations of evaluation and management by telemedicine and the availability of in person appointments. The patient expressed understanding and agreed to proceed.      I discussed the assessment and treatment plan with the patient. The patient was provided an opportunity to ask questions and all were answered. The patient agreed with the plan and demonstrated an understanding of the instructions.   The patient was advised to call back or seek an in-person evaluation if the symptoms worsen or if the condition fails to improve as anticipated.  I provided 25 minutes of non-face-to-face time during this encounter.   Norman Clay, MD    [facility] MD/PA/NP OP Progress Note  02/13/2019 9:47 AM Michele Lynch  MRN:  [id number]  Chief Complaint:  Chief Complaint    Follow-up; Other     HPI:  This is a follow-up appointment for schizophrenia.  She states that she has been feeling better since her last visit.  She believes that it was due to sinus infection; she had a spike fever since the last visit, and her mood improves after starting antibiotics.  Although she still feels stressed at work with limited supply/resources, she feels grateful to have a job. She tries what she can do. She reports good relationship with her children. She also contacts with her ex-husband every day and reports good relationship. She has hypersomnia, although it has improved since the last visit. She has less fatigue. She denies anhedonia. She denies SI. She denies AH, VH, paranoia, ideas of reference. She eats less and feels good to have weight loss. She does weights training every morning. She denies any concern after uptitration of Abilify.    223 lbs  Wt Readings from Last 3  Encounters:  01/09/18 229 lb (103.9 kg)  10/05/17 229 lb (103.9 kg)  07/05/17 232 lb (105.2 kg)    Visit Diagnosis:    ICD-10-CM   1. Schizophrenia, unspecified type ([place])  F20.9     Past Psychiatric History: Please see initial evaluation for full details. I have reviewed the history. No updates at this time.     Past Medical History:  Past Medical History:  Diagnosis Date  . Allergy   . History of bladder infections   . Personal history of colonic polyps - pre-cancerous 01/01/2013  . Pre-diabetes   . PVC (premature ventricular contraction)     Past Surgical History:  Procedure Laterality Date  . [employer]  . ESOPHAGOGASTRODUODENOSCOPY  2007   Gessner (gastropathy, esophagitis - GERD vs. EE)  . KNEE SURGERY Left   . NASAL SINUS SURGERY    . TONSILLECTOMY  1972  . VAGINAL HYSTERECTOMY  1995    Family Psychiatric History: Please see initial evaluation for full details. I have reviewed the history. No updates at this time.     Family History:  Family History  Problem Relation Age of Onset  . Arthritis Mother   . COPD Mother   . Cancer Father        leukemia  . Schizophrenia Father   . Post-traumatic stress disorder Father   . Multiple sclerosis Paternal Aunt   . Heart disease Sister        PACs  . Diabetes Brother   .  Gout Brother   . Alcohol abuse Brother   . Post-traumatic stress disorder Brother   . Colon cancer Neg Hx     Social History:  Social History   Socioeconomic History  . Marital status: Divorced    Spouse name: Not on file  . Number of children: Not on file  . Years of education: Not on file  . Highest education level: Not on file  Occupational History  . Not on file  Social Needs  . Financial resource strain: Not on file  . Food insecurity    Worry: Not on file    Inability: Not on file  . Transportation needs    Medical: Not on file    Non-medical: Not on file  Tobacco Use  . Smoking status: Never Smoker  .  Smokeless tobacco: Never Used  Substance and Sexual Activity  . Alcohol use: No    Comment: 05-24-2016 rarely  . Drug use: No    Comment: 05-24-2016 per pt no  . Sexual activity: Not Currently  Lifestyle  . Physical activity    Days per week: Not on file    Minutes per session: Not on file  . Stress: Not on file  Relationships  . Social Herbalist on phone: Not on file    Gets together: Not on file    Attends religious service: Not on file    Active member of club or organization: Not on file    Attends meetings of clubs or organizations: Not on file    Relationship status: Not on file  Other Topics Concern  . Not on file  Social History Narrative   Work or School: works for [employer]: lives alone      Spiritual Beliefs: Baptist      Lifestyle: walking 5 days; diet is "good"             Allergies:  Allergies  Allergen Reactions  . Orlistat Other (See Comments)    Cognitive problems and neurological problems( memory, speaking, Heart PVC, coordination, vision, impaired discission making)    Metabolic Disorder Labs: No results found for: HGBA1C, MPG No results found for: PROLACTIN No results found for: CHOL, TRIG, HDL, CHOLHDL, VLDL, LDLCALC Lab Results  Component Value Date   TSH 1.65 01/07/2014    Therapeutic Level Labs: No results found for: LITHIUM No results found for: VALPROATE No components found for:  CBMZ  Current Medications: Current Outpatient Medications  Medication Sig Dispense Refill  . ARIPiprazole (ABILIFY) 10 MG tablet Take 1 tablet (10 mg total) by mouth daily. 90 tablet 1  . Cholecalciferol (VITAMIN D3) 2000 UNITS TABS Take by mouth daily.    Marland Kitchen estradiol (ESTRACE) 1 MG tablet Take 1 mg by mouth daily.    . Multiple Vitamin (MULTIVITAMIN) capsule Take 1 capsule by mouth daily.     No current facility-administered medications for this visit.      Musculoskeletal: Strength & Muscle Tone: N/A Gait & Station:  N/A Patient leans: N/A  Psychiatric Specialty Exam: Review of Systems  Psychiatric/Behavioral: Negative for depression, hallucinations, memory loss, substance abuse and suicidal ideas. The patient is nervous/anxious. The patient does not have insomnia.   All other systems reviewed and are negative.   There were no vitals taken for this visit.There is no height or weight on file to calculate BMI.  General Appearance: Fairly Groomed  Eye Contact:  Good  Speech:  Clear and Coherent  Volume:  Normal  Mood:  "better"  Affect:  Appropriate, Congruent and euthymic, reactive  Thought Process:  Coherent  Orientation:  Full (Time, Place, and Person)  Thought Content: Logical   Suicidal Thoughts:  No  Homicidal Thoughts:  No  Memory:  Immediate;   Good  Judgement:  Good  Insight:  Good  Psychomotor Activity:  Normal  Concentration:  Concentration: Good and Attention Span: Good  Recall:  Good  Fund of Knowledge: Good  Language: Good  Akathisia:  No  Handed:  Right  AIMS (if indicated): not done  Assets:  Communication Skills Desire for Improvement  ADL's:  Intact  Cognition: WNL  Sleep:  hypersomnia   Screenings:   Assessment and Plan:  PAYZLEIGH SPAINHOWER is a 58 y.o. year old female with a history of unspecified schizophrenia spectrum disorder , who presents for follow up appointment for Schizophrenia, unspecified type ([place])  # unspecified schizophrenia spectrum disorder There has been improvement in depressive symptoms and fatigue since her last visit, which coincided with completing treatment for sinusitis, and up titration of Abilify.  We will continue current dose of Abilify to target schizophrenia.  Discussed potential risk of EPS and metabolic side effect.  Noted that she demonstrated paranoia, and disorganized thought process at the initial visit, although she has not displayed any recent visits. Provided psychoeducation for maintenance therapy.  Discussed behavioral activation.    # Hypersomnia She reports occasional snoring, fatigue and hypersomnia. She declines to do sleep study (never been evaluated for sleep apnea) given she does not like to wear CPAP.  Will continue to discuss as needed.  Plan 1. Increase Abilify 10 mg daily  2. Next appointment: in January  The patient demonstrates the following risk factors for suicide: Chronic risk factors for suicide include: psychiatric disorder of r/o schizophrenia. Acute risk factorsfor suicide include: loss (financial, interpersonal, professional). Protective factorsfor this patient include: hope for the future. Considering these factors, the overall suicide risk at this point appears to be low. Patient isappropriate for outpatient follow up.   Norman Clay, MD 02/13/2019, 9:47 AM

## 2019-02-13 ENCOUNTER — Ambulatory Visit (INDEPENDENT_AMBULATORY_CARE_PROVIDER_SITE_OTHER): Payer: BC Managed Care – PPO | Admitting: Psychiatry

## 2019-02-13 ENCOUNTER — Other Ambulatory Visit: Payer: Self-pay

## 2019-02-13 ENCOUNTER — Encounter (HOSPITAL_COMMUNITY): Payer: Self-pay | Admitting: Psychiatry

## 2019-02-13 DIAGNOSIS — F209 Schizophrenia, unspecified: Secondary | ICD-10-CM

## 2019-02-13 MED ORDER — ARIPIPRAZOLE 10 MG PO TABS: 10.0000 mg | ORAL_TABLET | Freq: Every day | ORAL | 1 refills | Status: AC

## 2019-02-13 NOTE — Patient Instructions (Signed)
1. Increase Abilify 10 mg daily  2. Next appointment: in January

## 2019-05-16 ENCOUNTER — Ambulatory Visit (HOSPITAL_COMMUNITY): Payer: BC Managed Care – PPO | Admitting: Psychiatry

## 2020-12-10 ENCOUNTER — Other Ambulatory Visit: Payer: Self-pay

## 2020-12-10 ENCOUNTER — Ambulatory Visit (AMBULATORY_SURGERY_CENTER): Payer: BC Managed Care – PPO | Admitting: *Deleted

## 2020-12-10 VITALS — Ht 63.0 in | Wt 230.0 lb

## 2020-12-10 DIAGNOSIS — Z86010 Personal history of colonic polyps: Secondary | ICD-10-CM

## 2020-12-10 NOTE — Progress Notes (Signed)
Pt verified name, DOB, address and insurance during PV today.  Pt mailed instruction packet of Emmi video, copy of consent form to read and not return, and instructions.   PV completed over the phone.  Pt encouraged to call with questions or issues.  My Chart instructions to pt as well    No egg or soy allergy known to patient  No issues with past sedation with any surgeries or procedures Patient denies ever being told they had issues or difficulty with intubation  No FH of Malignant Hyperthermia No diet pills per patient No home 02 use per patient  No blood thinners per patient  Pt denies issues with constipation  No A fib or A flutter  EMMI video to pt or via [place] 19 guidelines implemented in [facility] today with Pt and RN   Pt is fully vaccinated  for Covid   Due to the COVID-19 pandemic we are asking patients to follow certain guidelines.  Pt aware of COVID protocols and [facility] guidelines

## 2020-12-22 ENCOUNTER — Encounter: Payer: Self-pay | Admitting: Internal Medicine

## 2020-12-25 ENCOUNTER — Ambulatory Visit (AMBULATORY_SURGERY_CENTER): Payer: BC Managed Care – PPO | Admitting: Internal Medicine

## 2020-12-25 ENCOUNTER — Encounter: Payer: Self-pay | Admitting: Internal Medicine

## 2020-12-25 ENCOUNTER — Other Ambulatory Visit: Payer: Self-pay

## 2020-12-25 VITALS — BP 142/69 | HR 83 | Temp 97.5°F | Resp 16 | Ht 63.0 in | Wt 230.0 lb

## 2020-12-25 DIAGNOSIS — Z86010 Personal history of colonic polyps: Secondary | ICD-10-CM | POA: Diagnosis present

## 2020-12-25 MED ORDER — SODIUM CHLORIDE 0.9 % IV SOLN
500.0000 mL | Freq: Once | INTRAVENOUS | Status: DC
Start: 2020-12-25 — End: 2020-12-25

## 2020-12-25 NOTE — Progress Notes (Signed)
A and O x3. Report to RN. Tolerated MAC anesthesia well. 

## 2020-12-25 NOTE — Progress Notes (Signed)
Pt's states no medical or surgical changes since previsit or office visit. 

## 2020-12-25 NOTE — Patient Instructions (Addendum)
No polyps today!  Next routine colonoscopy or other screening test in 10 years - 2032.  I appreciate the opportunity to care for you. Gatha Mayer, MD, [facility]  Resume previous diet Continue current medications Repeat colonoscopy in 10 years!!!!  YOU HAD AN ENDOSCOPIC PROCEDURE TODAY AT [facility]:   Refer to the procedure report that was given to you for any specific questions about what was found during the examination.  If the procedure report does not answer your questions, please call your gastroenterologist to clarify.  If you requested that your care partner not be given the details of your procedure findings, then the procedure report has been included in a sealed envelope for you to review at your convenience later.  YOU SHOULD EXPECT: Some feelings of bloating in the abdomen. Passage of more gas than usual.  Walking can help get rid of the air that was put into your GI tract during the procedure and reduce the bloating. If you had a lower endoscopy (such as a colonoscopy or flexible sigmoidoscopy) you may notice spotting of blood in your stool or on the toilet paper. If you underwent a bowel prep for your procedure, you may not have a normal bowel movement for a few days.  Please Note:  You might notice some irritation and congestion in your nose or some drainage.  This is from the oxygen used during your procedure.  There is no need for concern and it should clear up in a day or so.  SYMPTOMS TO REPORT IMMEDIATELY: Following lower endoscopy (colonoscopy or flexible sigmoidoscopy):  Excessive amounts of blood in the stool  Significant tenderness or worsening of abdominal pains  Swelling of the abdomen that is new, acute  Fever of 100F or higher  for urgent or emergent issues, a gastroenterologist can be reached at any hour by calling [phone]. Do not use MyChart messaging for urgent concerns.   DIET:  We do recommend a small meal at first, but then you  may proceed to your regular diet.  Drink plenty of fluids but you should avoid alcoholic beverages for 24 hours.  ACTIVITY:  You should plan to take it easy for the rest of today and you should NOT DRIVE or use heavy machinery until tomorrow (because of the sedation medicines used during the test).    FOLLOW UP: Our staff will call the number listed on your records 48-72 hours following your procedure to check on you and address any questions or concerns that you may have regarding the information given to you following your procedure. If we do not reach you, we will leave a message.  We will attempt to reach you two times.  During this call, we will ask if you have developed any symptoms of COVID 19. If you develop any symptoms (ie: fever, flu-like symptoms, shortness of breath, cough etc.) before then, please call [phone].  If you test positive for Covid 19 in the 2 weeks post procedure, please call and report this information to [place].    If any biopsies were taken you will be contacted by phone or by letter within the next 1-3 weeks.  Please call us at [phone] if you have not heard about the biopsies in 3 weeks.   SIGNATURES/CONFIDENTIALITY: You and/or your care partner have signed paperwork which will be entered into your electronic medical record.  These signatures attest to the fact that that the information above on your After Visit Summary has been  reviewed and is understood.  Full responsibility of the confidentiality of this discharge information lies with you and/or your care-partner.

## 2020-12-25 NOTE — Op Note (Signed)
[facility] Patient Name: Michele Lynch Procedure Date: 12/25/2020 3:14 PM MRN: [id number] Endoscopist: Gatha Mayer , MD Age: 60 Referring MD:  Date of Birth: 1961-03-04 Gender: Female Account #: [id number] Procedure:                Colonoscopy Indications:              High risk colon cancer surveillance: Personal                            history of sessile serrated colon polyp (less than                            10 mm in size) with no dysplasia, Last colonoscopy:                            2014 Medicines:                Propofol per Anesthesia, Monitored Anesthesia Care Procedure:                Pre-Anesthesia Assessment:                           - Prior to the procedure, a History and Physical                            was performed, and patient medications and                            allergies were reviewed. The patient's tolerance of                            previous anesthesia was also reviewed. The risks                            and benefits of the procedure and the sedation                            options and risks were discussed with the patient.                            All questions were answered, and informed consent                            was obtained. Prior Anticoagulants: The patient has                            taken no previous anticoagulant or antiplatelet                            agents. ASA Grade Assessment: III - A patient with                            severe systemic disease. After reviewing the risks  and benefits, the patient was deemed in                            satisfactory condition to undergo the procedure.                           After obtaining informed consent, the colonoscope                            was passed under direct vision. Throughout the                            procedure, the patient's blood pressure, pulse, and                            oxygen saturations were  monitored continuously. The                            Olympus CF-HQ190L [id number]) Colonoscope was                            introduced through the anus and advanced to the the                            cecum, identified by appendiceal orifice and                            ileocecal valve. The colonoscopy was performed                            without difficulty. The patient tolerated the                            procedure well. The quality of the bowel                            preparation was good. The ileocecal valve,                            appendiceal orifice, and rectum were photographed. Scope In: 3:38:38 PM Scope Out: 3:49:25 PM Scope Withdrawal Time: 0 hours 8 minutes 31 seconds  Total Procedure Duration: 0 hours 10 minutes 47 seconds  Findings:                 The perianal and digital rectal examinations were                            normal.                           The entire examined colon appeared normal on direct                            and retroflexion views. Complications:            No immediate complications.  Estimated Blood Loss:     Estimated blood loss: none. Impression:               - The entire examined colon is normal on direct and                            retroflexion views.                           - No specimens collected.                           - Personal history of colonic polyp diminutive ssp                            2014. Recommendation:           - Patient has a contact number available for                            emergencies. The signs and symptoms of potential                            delayed complications were discussed with the                            patient. Return to normal activities tomorrow.                            Written discharge instructions were provided to the                            patient.                           - Resume previous diet.                           - Continue present  medications.                           - Repeat colonoscopy in 10 years. Gatha Mayer, MD 12/25/2020 3:54:06 PM This report has been signed electronically.

## 2020-12-25 NOTE — Progress Notes (Signed)
[facility] History and Physical   Primary Care Physician:  Caryl Bis, MD   Reason for Procedure:   Hx colon polyps  Plan:    colonoscopy     HPI: Michele Lynch is a 60 y.o. female w/ hx diminutive ssp polyp removed 2015. Here for surveillance colonoscopy.   Past Medical History:  Diagnosis Date   Allergy    Diabetes mellitus without complication ([place])    GERD (gastroesophageal reflux disease)    History of bladder infections    Personal history of colonic polyps - pre-cancerous 01/01/2013   PONV (postoperative nausea and vomiting)    with 1st c section   PVC (premature ventricular contraction)     Past Surgical History:  Procedure Laterality Date   CESAREAN SECTION  1991, 1993   COLONOSCOPY     ESOPHAGOGASTRODUODENOSCOPY  2007   Martyn Timme (gastropathy, esophagitis - GERD vs. EE)   KNEE SURGERY Left    NASAL SINUS SURGERY     POLYPECTOMY     Miguel Barrera    Prior to Admission medications   Medication Sig Start Date End Date Taking? Authorizing Provider  ARIPiprazole (ABILIFY) 10 MG tablet Take 1 tablet (10 mg total) by mouth daily. 02/13/19  Yes Norman Clay, MD  Cholecalciferol (VITAMIN D3) 2000 UNITS TABS Take by mouth daily.   Yes [provider]  estradiol (ESTRACE) 2 MG tablet Take 2 mg by mouth daily. 09/26/20  Yes [provider]  loratadine (CLARITIN) 10 MG tablet Take 10 mg by mouth daily.   Yes [provider]  metFORMIN (GLUCOPHAGE-XR) 500 MG 24 hr tablet Take 2,000 mg by mouth daily. 10/29/20  Yes [provider]  Multiple Vitamin (MULTIVITAMIN) capsule Take 1 capsule by mouth daily.   Yes [provider]  pantoprazole (PROTONIX) 40 MG tablet Take 40 mg by mouth daily. 11/26/20  Yes [provider]  rosuvastatin (CRESTOR) 5 MG tablet Take 5 mg by mouth at bedtime. 11/04/20  Yes [provider]  TRULICITY [id number] SOPN Inject 0.75 mg  into the skin once a week. 12/04/20  Yes [provider]  ondansetron (ZOFRAN) 8 MG tablet Take 8 mg by mouth 3 (three) times daily as needed. Patient not taking: No sig reported 08/04/20   [provider]  triamcinolone cream (KENALOG) 0.1 % Apply topically 2 (two) times daily. Patient not taking: No sig reported 10/07/20   [provider]    Current Outpatient Medications  Medication Sig Dispense Refill   ARIPiprazole (ABILIFY) 10 MG tablet Take 1 tablet (10 mg total) by mouth daily. 90 tablet 1   Cholecalciferol (VITAMIN D3) 2000 UNITS TABS Take by mouth daily.     estradiol (ESTRACE) 2 MG tablet Take 2 mg by mouth daily.     loratadine (CLARITIN) 10 MG tablet Take 10 mg by mouth daily.     metFORMIN (GLUCOPHAGE-XR) 500 MG 24 hr tablet Take 2,000 mg by mouth daily.     Multiple Vitamin (MULTIVITAMIN) capsule Take 1 capsule by mouth daily.     pantoprazole (PROTONIX) 40 MG tablet Take 40 mg by mouth daily.     rosuvastatin (CRESTOR) 5 MG tablet Take 5 mg by mouth at bedtime.     TRULICITY [id number] SOPN Inject 0.75 mg into the skin once a week.     ondansetron (ZOFRAN) 8 MG tablet Take 8 mg by mouth 3 (three) times daily as needed. (Patient not taking: No  sig reported)     triamcinolone cream (KENALOG) 0.1 % Apply topically 2 (two) times daily. (Patient not taking: No sig reported)     Current Facility-Administered Medications  Medication Dose Route Frequency Provider Last Rate Last Admin   0.9 %  sodium chloride infusion  500 mL Intravenous Once Gatha Mayer, MD        Allergies as of 12/25/2020 - Review Complete 12/25/2020  Allergen Reaction Noted   Orlistat Other (See Comments) 05/24/2016    Family History  Problem Relation Age of Onset   Arthritis Mother    COPD Mother    Cancer Father        leukemia   Schizophrenia Father    Post-traumatic stress disorder Father    Heart disease Sister        PACs   Diabetes Brother    Gout Brother     Alcohol abuse Brother    Post-traumatic stress disorder Brother    Multiple sclerosis Paternal Aunt    Colon cancer Neg Hx    Esophageal cancer Neg Hx    Colon polyps Neg Hx    Rectal cancer Neg Hx    Stomach cancer Neg Hx     Social History   Socioeconomic History   Marital status: Divorced    Spouse name: Not on file   Number of children: Not on file   Years of education: Not on file   Highest education level: Not on file  Occupational History   Not on file  Tobacco Use   Smoking status: Never   Smokeless tobacco: Never  Vaping Use   Vaping Use: Never used  Substance and Sexual Activity   Alcohol use: No    Comment: 05-24-2016 rarely   Drug use: No    Comment: 05-24-2016 per pt no   Sexual activity: Not Currently  Other Topics Concern   Not on file  Social History Narrative   Work or School: works for [employer] Situation: lives alone      Spiritual Beliefs: Baptist      Lifestyle: walking 5 days; diet is "good"            Social Determinants of Radio broadcast assistant Strain: Not on file  Food Insecurity: Not on file  Transportation Needs: Not on file  Physical Activity: Not on file  Stress: Not on file  Social Connections: Not on file  Intimate Partner Violence: Not on file    Review of Systems:  All other review of systems negative except as mentioned in the HPI.  Physical Exam: Vital signs BP 129/74   Pulse 98   Temp (!) 97.5 F (36.4 C) (Temporal)   Ht '5\' 3"'$  (1.6 m)   Wt 230 lb (104.3 kg)   SpO2 96%   BMI 40.74 kg/m   General:   Alert,  Well-developed, well-nourished, pleasant and cooperative in NAD Lungs:  Clear throughout to auscultation.   Heart:  Regular rate and rhythm; no murmurs, clicks, rubs,  or gallops. Abdomen:  Soft, nontender and nondistended. Normal bowel sounds.   Neuro/Psych:  Alert and cooperative. Normal mood and affect. A and O x 3   '@Merland Holness'$  Simonne Maffucci, MD, [facility] [phone]  (pager) 12/25/2020 3:21 PM@

## 2020-12-29 ENCOUNTER — Telehealth: Payer: Self-pay | Admitting: *Deleted

## 2020-12-29 NOTE — Telephone Encounter (Signed)
  Follow up Call-  Call back number 12/25/2020  Post procedure Call Back phone  # [phone]  Some recent data might be hidden     Patient questions:  Do you have a fever, pain , or abdominal swelling? No. Pain Score  0 *  Have you tolerated food without any problems? Yes.    Have you been able to return to your normal activities? Yes.    Do you have any questions about your discharge instructions: Diet   No. Medications  No. Follow up visit  No.  Do you have questions or concerns about your Care? No.  Actions: * If pain score is 4 or above: No action needed, pain <4.  Have you developed a fever since your procedure? no  2.   Have you had an respiratory symptoms (SOB or cough) since your procedure? no  3.   Have you tested positive for COVID 19 since your procedure no  4.   Have you had any family members/close contacts diagnosed with the COVID 19 since your procedure?  no   If yes to any of these questions please route to Joylene John, RN and Joella Prince, RN

## 2023-07-25 ENCOUNTER — Other Ambulatory Visit (HOSPITAL_COMMUNITY): Payer: Self-pay | Admitting: Family Medicine

## 2023-07-25 DIAGNOSIS — M5412 Radiculopathy, cervical region: Secondary | ICD-10-CM

## 2023-07-31 ENCOUNTER — Ambulatory Visit (HOSPITAL_COMMUNITY): Payer: Self-pay

## 2023-07-31 ENCOUNTER — Encounter (HOSPITAL_COMMUNITY): Payer: Self-pay
